# Patient Record
Sex: Female | Born: 1990 | Race: Black or African American | Hispanic: No | Marital: Single | Smoking: Never smoker
Health system: Southern US, Community
[De-identification: ages and names within clinical notes are randomized; demographics above are authoritative.]

## PROBLEM LIST (undated history)

## (undated) DIAGNOSIS — R519 Headache, unspecified: Secondary | ICD-10-CM

## (undated) DIAGNOSIS — N946 Dysmenorrhea, unspecified: Secondary | ICD-10-CM

## (undated) HISTORY — DX: Dysmenorrhea, unspecified: N94.6

## (undated) HISTORY — DX: Headache, unspecified: R51.9

## (undated) HISTORY — PX: NO PAST SURGERIES: SHX2092

---
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
## 2012-02-24 ENCOUNTER — Encounter (HOSPITAL_COMMUNITY): Payer: Self-pay

## 2012-02-24 ENCOUNTER — Emergency Department (INDEPENDENT_AMBULATORY_CARE_PROVIDER_SITE_OTHER)
Admission: EM | Admit: 2012-02-24 | Discharge: 2012-02-24 | Disposition: A | Discharge status: Home / Self Care | Payer: Medicaid Other | Source: Home / Self Care | Attending: Emergency Medicine | Admitting: Emergency Medicine

## 2012-02-24 DIAGNOSIS — L0291 Cutaneous abscess, unspecified: Secondary | ICD-10-CM

## 2012-02-24 MED ORDER — SULFAMETHOXAZOLE-TMP DS 800-160 MG PO TABS: 2.0000 | ORAL_TABLET | Freq: Two times a day (BID) | ORAL | Status: AC

## 2012-02-24 MED ORDER — MUPIROCIN 2 % EX OINT: TOPICAL_OINTMENT | Freq: Three times a day (TID) | CUTANEOUS | Status: AC

## 2012-02-24 NOTE — Discharge Instructions (Signed)
Abscess An abscess (boil or furuncle) is an infected area that contains a collection of pus.  SYMPTOMS Signs and symptoms of an abscess include pain, tenderness, redness, or hardness. You may feel a moveable soft area under your skin. An abscess can occur anywhere in the body.  TREATMENT  A surgical cut (incision) may be made over your abscess to drain the pus. Gauze may be packed into the space or a drain may be looped through the abscess cavity (pocket). This provides a drain that will allow the cavity to heal from the inside outwards. The abscess may be painful for a few days, but should feel much better if it was drained.  Your abscess, if seen early, may not have localized and may not have been drained. If not, another appointment may be required if it does not get better on its own or with medications. HOME CARE INSTRUCTIONS   Only take over-the-counter or prescription medicines for pain, discomfort, or fever as directed by your caregiver.   Take your antibiotics as directed if they were prescribed. Finish them even if you start to feel better.   Keep the skin and clothes clean around your abscess.   If the abscess was drained, you will need to use gauze dressing to collect any draining pus. Dressings will typically need to be changed 3 or more times a day.   The infection may spread by skin contact with others. Avoid skin contact as much as possible.   Practice good hygiene. This includes regular hand washing, cover any draining skin lesions, and do not share personal care items.   If you participate in sports, do not share athletic equipment, towels, whirlpools, or personal care items. Shower after every practice or tournament.   If a draining area cannot be adequately covered:   Do not participate in sports.   Children should not participate in day care until the wound has healed or drainage stops.   If your caregiver has given you a follow-up appointment, it is very important  to keep that appointment. Not keeping the appointment could result in a much worse infection, chronic or permanent injury, pain, and disability. If there is any problem keeping the appointment, you must call back to this facility for assistance.  SEEK MEDICAL CARE IF:   You develop increased pain, swelling, redness, drainage, or bleeding in the wound site.   You develop signs of generalized infection including muscle aches, chills, fever, or a general ill feeling.   You have an oral temperature above 102 F (38.9 C).  MAKE SURE YOU:   Understand these instructions.   Will watch your condition.   Will get help right away if you are not doing well or get worse.  Document Released: 09/09/2005 Document Revised: 11/19/2011 Document Reviewed: 07/03/2008 Exit[employer] Patient Information 2012 [employer].  You have had an abscess drained.  An abscess is a collection of pus caused by infection with skin bacteria such as Streptococcus or Staphylococcus.  Since this is and infection, you may be contagious.  For the first 2 days, leave the dressing in place and keep it clean and dry. This means you should not get it wet.  You will have to take a sponge bath rather than a shower.  If the abscess was packed, we may instruct you to come back in 2 to 3 days to have the packing removed.  If the abscess was not packed, you may remove the dressing yourself in 2 days and take   care of the wound yourself.  After the packing is out, change the dressing at least once a day.  You may bathe or shower once the packing has been removed.  Assemble all the dressing material before you change the dressing, wear gloves, dispose of the soiled dressing material well and wash your hands before and after changing the dressing.  Wash the area well with soap and water, taking care to remove all the dried blood and drainage.  Apply a thin layer of antibiotic ointment (Bacitracin or Polysporin) around the abscess cavity, then apply  a gauze dressing.  You may want to use a non-adherent dressing like Telfa.  Fasten this in place well with tape.  Continue to change the dressing until there is no further drainage.  Finish up the entire prescription of any antibiotics that you have been given.  Take infectious precautions since the bacteria that cause these abscesses may be contagious.  Wash hands frequently or use hand sanitizer, especially after touching the abscess area or changing dressings.  Do not allow anyone else to use your towel or washcloth and wash these items after each use until the abscess has healed.  You may want to use an antibacterial soap such as Dial or Safeguard or a prescription body wash like Hibiclens.  You also may want to consider spraying the tub or shower with a disinfectant such a Lysol until the abscess has healed.  Things that should prompt you to return to the office for a recheck include:  Fever over 100 degrees, increasing pain or drainage, failure of the abscess to heal after 10 days, or other skin lesions elsewhere.   

## 2012-02-24 NOTE — ED Provider Notes (Signed)
Chief Complaint  Patient presents with  . Skin Ulcer    History of Present Illness:   The patient is an 21 year old female who has had a painful nodule in the right submammary area since yesterday. This has not drained any fluid, blood, or pus. She has no other skin lesions and has not had prior history of skin infections or abscesses. She denies any fever or chills. She has not had diabetes or prior history of MRSA infections.  Review of Systems:  Other than noted above, the patient denies any of the following symptoms: Systemic:  No fever, chills, sweats, weight loss, or fatigue. ENT:  No nasal congestion, rhinorrhea, sore throat, swelling of lips, tongue or throat. Resp:  No cough, wheezing, or shortness of breath. Skin:  No rash, itching, nodules, or suspicious lesions.  PMFSH:  Past medical history, family history, social history, meds, and allergies were reviewed.  Physical Exam:   Vital signs:  BP 121/66  Pulse 80  Temp(Src) 98.6 F (37 C) (Oral)  Resp 10  SpO2 100% Gen:  Alert, oriented, in no distress. Skin:  She has a 1 x 1.5 cm tender, raised, red nodule in the right submammary area. This was not fluctuant and not draining any pus. Her skin was otherwise clear.  Assessment:   Diagnoses that have been ruled out:  None  Diagnoses that are still under consideration:  None  Final diagnoses:  Abscess    Plan:   1.  The following meds were prescribed:   New Prescriptions   MUPIROCIN OINTMENT (BACTROBAN) 2 %    Apply topically 3 (three) times daily.   SULFAMETHOXAZOLE-TRIMETHOPRIM (BACTRIM DS) 800-160 MG PER TABLET    Take 2 tablets by mouth 2 (two) times daily.   2.  The patient was instructed in symptomatic care and handouts were given. 3.  The patient was told to return if becoming worse in any way, if no better in 3 or 4 days, and given some red flag symptoms that would indicate earlier return.     Reuben Likes, MD 02/24/12 [phone]

## 2012-02-24 NOTE — ED Notes (Signed)
Concerned about lesion on right anterior chest wall since yesterday; no other lesions present, no one else in home affected

## 2012-05-27 ENCOUNTER — Ambulatory Visit (INDEPENDENT_AMBULATORY_CARE_PROVIDER_SITE_OTHER): Payer: Medicaid Other | Admitting: Obstetrics and Gynecology

## 2012-05-27 ENCOUNTER — Encounter: Payer: Self-pay | Admitting: Obstetrics and Gynecology

## 2012-05-27 VITALS — BP 108/64 | Temp 98.9°F | Resp 16 | Ht 75.0 in | Wt 190.0 lb

## 2012-05-27 DIAGNOSIS — N946 Dysmenorrhea, unspecified: Secondary | ICD-10-CM | POA: Insufficient documentation

## 2012-05-27 DIAGNOSIS — Z309 Encounter for contraceptive management, unspecified: Secondary | ICD-10-CM

## 2012-05-27 MED ORDER — LEVONORGEST-ETH ESTRAD 91-DAY 0.15-0.03 &0.01 MG PO TABS: 1.0000 | ORAL_TABLET | Freq: Every day | ORAL | Status: DC

## 2012-05-27 NOTE — Progress Notes (Signed)
Patient ID: Mackenzie Mcknight, female   DOB: May 13, 1991, 21 y.o.   MRN: [id number] Last Pap: New GYN WNL: N/A Regular Periods:yes Contraception: None  Monthly Breast exam:yes Tetanus<4yrs:yes Nl.Bladder Function:yes Daily BMs:no Healthy Diet:yes Calcium:no/takes Biotin Mammogram:no Exercise:yes Seatbelt: yes Abuse at home: no Stressful work:no Sigmoid-colonoscopy: Never had Bone Density: No  *Wants to discuss birth control options (Contraception list provided) Pt with a mense q month.  Lasting 7-8 days.  Pain 8/10 from cramps no relief with midol.  Changes a pad 4 times a day Pt without complaints Physical Examination: General appearance - alert, well appearing, and in no distress Mental status - normal mood, behavior, speech, dress, motor activity, and thought processes Neck - supple, no significant adenopathy, thyroid exam: thyroid is normal in size without nodules or tenderness Chest - clear to auscultation, no wheezes, rales or rhonchi, symmetric air entry Heart - normal rate and regular rhythm Abdomen - soft, nontender, nondistended, no masses or organomegaly Breasts - breasts appear normal, no suspicious masses, no skin or nipple changes or axillary nodes Pelvic - normal external genitalia.  Pt declined speculum exam.  Pelvic exam.  Normal size uterus , non tender, no adnexal  tenderness Rectal - normal rectal, no masses, rectal exam not indicated Back exam - full range of motion, no tenderness, palpable spasm or pain on motion Neurological - alert, oriented, normal speech, no focal findings or movement disorder noted Musculoskeletal - no joint tenderness, deformity or swelling Extremities - no edema, redness or tenderness in the calves or thighs Skin - normal coloration and turgor, no rashes, no suspicious skin lesions noted Routine exam Pap sent no Mammogram due no all BC reviewed.  pt chose seasonique.  written and verbal instructions given RT 3 months Pt not sexually  active for 2 yrs.  She declined cultures

## 2012-09-02 ENCOUNTER — Telehealth: Payer: Self-pay | Admitting: Obstetrics and Gynecology

## 2012-09-02 NOTE — Telephone Encounter (Signed)
Spoke with pt rgd msg pt states started new bc having irreg bleeding advised pt normal to have irreg bleeding when starting new bc will have irreg bleeding first three months of starting pt also to have 3 mo f/u pt has appt 09/16/12 at 2:15 with ND also advised pt rx at pharm pt voice understanding

## 2012-09-16 ENCOUNTER — Ambulatory Visit (INDEPENDENT_AMBULATORY_CARE_PROVIDER_SITE_OTHER): Payer: Medicaid Other | Admitting: Obstetrics and Gynecology

## 2012-09-16 ENCOUNTER — Encounter: Payer: Self-pay | Admitting: Obstetrics and Gynecology

## 2012-09-16 VITALS — BP 108/58 | Wt 207.5 lb

## 2012-09-16 DIAGNOSIS — Z3041 Encounter for surveillance of contraceptive pills: Secondary | ICD-10-CM

## 2012-09-16 NOTE — Progress Notes (Signed)
Pt here for [facility] pill f/u.  Pt without c/o .  No SOB, CP or leg pain.  Pt has had spotting.  The spotting has not resolved BP 108/58  Wt 207 lb 8 oz (94.121 kg)  LMP 09/08/2012 Physical Examination: General appearance - alert, well appearing, and in no distress Heart - normal rate and regular rhythm Abdomen - soft, nontender, nondistended, no masses or organomegaly Extremities - peripheral pulses normal, no pedal edema, no clubbing or cyanosis BC F/U Pt doing well continue seasonique

## 2012-09-16 NOTE — Patient Instructions (Signed)
Hormonal Contraception Information Estrogen and progesterone (progestin) are hormones used in many forms of birth control (contraception). These 2 hormones make up most hormonal contraceptives. Hormonal contraceptives use either:   A combination of estrogen hormone and progesterone hormone in the form of a:  Pill. Typical pill packs include 21 days of active hormone pills and 7 days of non-hormonal pills.During the non-hormone week, you will have your period. There are certain types of pills that include more days of active hormones.  Patch. The patch is placed on the lower abdomen every week for 3 weeks, and not on the fourth week.  Vaginal ring. The ring is placed in the vagina and left there for 3 weeks, and removed for 1 week.  Progesterone alone in the form of a(n):  Pill. Hormone pills are taken every day of the cycle.  Intrauterine device (IUD). The IUD is inserted during a menstrual period and removed or replaced every 5 years or less.  Implant. Plastic rods are placed under the skin of the upper arm and are removed or replaced every 3 years or less.  Injection. The injection is given once every 90 days. Pregnancy can still occur with any of these hormonal contraceptive methods. If there is any suspicion of pregnancy, take a pregnancy test and talk to your caregiver. ESTROGEN AND PROGESTERONE CONTRACEPTIVES Estrogen and progesterone contraceptives can prevent pregnancy by:  Stopping the actions of other reproductive hormones.   Stopping the release of an egg (ovulation).  Changing the lining of the uterus. This change makes it more difficult for an egg to implant. Side effects from estrogen occur more often in the first 2 or 3 months. Talk to your caregiver about what side effects may affect you. If you develop persistent side effects or they are severe, talk to your caregiver. PROGESTERONE CONTRACEPTIVES Progesterone only contraceptives can prevent pregnancy by:   Blocking  ovulation.  Preventing the entry of sperm into the uterus by keeping the cervical mucus thick and sticky.  Slowing the action of fallopian tubes to slow sperm transport.  Changing the lining of the uterus. This change makes it more difficult for an egg to implant. Side effects of progesterone can vary. Talk to your caregiver about what side effects may affect you. If you develop persistent side effects or they are severe, talk to your caregiver. Document Released: 12/20/2007 Document Revised: 02/22/2012 Document Reviewed: 04/04/2011 Exit[employer] Patient Information 2013 [employer].  

## 2012-10-14 ENCOUNTER — Inpatient Hospital Stay (HOSPITAL_COMMUNITY)
Admission: AD | Admit: 2012-10-14 | Discharge: 2012-10-14 | Disposition: A | Discharge status: Home / Self Care | Payer: Medicaid Other | Source: Ambulatory Visit | Attending: Obstetrics & Gynecology | Admitting: Obstetrics & Gynecology

## 2012-10-14 ENCOUNTER — Telehealth: Payer: Self-pay | Admitting: Obstetrics and Gynecology

## 2012-10-14 DIAGNOSIS — B9689 Other specified bacterial agents as the cause of diseases classified elsewhere: Secondary | ICD-10-CM | POA: Insufficient documentation

## 2012-10-14 DIAGNOSIS — N949 Unspecified condition associated with female genital organs and menstrual cycle: Secondary | ICD-10-CM | POA: Insufficient documentation

## 2012-10-14 DIAGNOSIS — B373 Candidiasis of vulva and vagina: Secondary | ICD-10-CM | POA: Insufficient documentation

## 2012-10-14 DIAGNOSIS — B3731 Acute candidiasis of vulva and vagina: Secondary | ICD-10-CM

## 2012-10-14 DIAGNOSIS — A499 Bacterial infection, unspecified: Secondary | ICD-10-CM | POA: Insufficient documentation

## 2012-10-14 DIAGNOSIS — N760 Acute vaginitis: Secondary | ICD-10-CM

## 2012-10-14 LAB — WET PREP, GENITAL: Trich, Wet Prep: NONE SEEN

## 2012-10-14 LAB — URINALYSIS, ROUTINE W REFLEX MICROSCOPIC
Glucose, UA: NEGATIVE mg/dL
Hgb urine dipstick: NEGATIVE
Specific Gravity, Urine: 1.025 (ref 1.005–1.030)
pH: 6.5 (ref 5.0–8.0)

## 2012-10-14 LAB — POCT PREGNANCY, URINE: Preg Test, Ur: NEGATIVE

## 2012-10-14 LAB — URINE MICROSCOPIC-ADD ON

## 2012-10-14 MED ORDER — FLUCONAZOLE 150 MG PO TABS: ORAL_TABLET | ORAL | Status: DC

## 2012-10-14 MED ORDER — METRONIDAZOLE 500 MG PO TABS: 500.0000 mg | ORAL_TABLET | Freq: Two times a day (BID) | ORAL | Status: DC

## 2012-10-14 MED ORDER — NYSTATIN-TRIAMCINOLONE 100000-0.1 UNIT/GM-% EX CREA: TOPICAL_CREAM | CUTANEOUS | Status: DC

## 2012-10-14 NOTE — MAU Provider Note (Signed)
History     CSN: [id number]  Arrival date and time: 10/14/12 1408   First Provider Initiated Contact with Patient 10/14/12 1742      Chief Complaint  Patient presents with  . Vag issues    HPI  Pt is not pregnant and presents with vaginal irritation and reddness for 2 days.  Pt is a pt of [facility] and could not get an appointment until next week and pt has to return to school on Sunday in [place].  Pt is on [place]. She denies vaginal discharge  Past Medical History  Diagnosis Date  . Dysmenorrhea     No past surgical history on file.  No family history on file.  History  Substance Use Topics  . Smoking status: Never Smoker   . Smokeless tobacco: Never Used  . Alcohol Use: No    Allergies: No Known Allergies  Prescriptions prior to admission  Medication Sig Dispense Refill  . Levonorgestrel-Ethinyl Estradiol (SEASONIQUE) 0.15-0.03 &0.01 MG tablet Take 1 tablet by mouth daily.  1 Package  4    Review of Systems  Constitutional: Negative for fever and chills.  Gastrointestinal: Negative for nausea and vomiting.  Genitourinary: Negative for dysuria and urgency.   Physical Exam   Blood pressure 109/59, pulse 84, temperature 98.2 F (36.8 C), temperature source Oral, resp. rate 16, height 6\' 3"  (1.905 m), weight 92.194 kg (203 lb 4 oz), last menstrual period 09/08/2012.  Physical Exam  Nursing note and vitals reviewed. Constitutional: She is oriented to person, place, and time. She appears well-developed and well-nourished.  HENT:  Head: Normocephalic.  Eyes: Pupils are equal, round, and reactive to light.  Neck: Normal range of motion. Neck supple.  Cardiovascular: Normal rate.   Respiratory: Effort normal.  GI: Soft. She exhibits no distension. There is no tenderness. There is no rebound and no guarding.  Genitourinary:       Labia slightly edematous bilaterally; vaginal mucosa reddened; mod amount of frothy white discharge in vault; cervix- eversion noted,  NT; uterus and adnexa without palpable enlargement or tenderness  Musculoskeletal: Normal range of motion.  Neurological: She is alert and oriented to person, place, and time.  Skin: Skin is warm and dry.  Psychiatric: She has a normal mood and affect.    MAU Course  Procedures Results for orders placed during the hospital encounter of 10/14/12 (from the past 24 hour(s))  URINALYSIS, ROUTINE W REFLEX MICROSCOPIC     Status: Abnormal   Collection Time   10/14/12  3:40 PM      Component Value Range   Color, Urine YELLOW  YELLOW   APPearance CLOUDY (*) CLEAR   Specific Gravity, Urine 1.025  1.005 - 1.030   pH 6.5  5.0 - 8.0   Glucose, UA NEGATIVE  NEGATIVE mg/dL   Hgb urine dipstick NEGATIVE  NEGATIVE   Bilirubin Urine NEGATIVE  NEGATIVE   Ketones, ur NEGATIVE  NEGATIVE mg/dL   Protein, ur NEGATIVE  NEGATIVE mg/dL   Urobilinogen, UA 1.0  0.0 - 1.0 mg/dL   Nitrite NEGATIVE  NEGATIVE   Leukocytes, UA LARGE (*) NEGATIVE  URINE MICROSCOPIC-ADD ON     Status: Abnormal   Collection Time   10/14/12  3:40 PM      Component Value Range   Squamous Epithelial / LPF MANY (*) RARE   WBC, UA 21-50  <3 WBC/hpf   Bacteria, UA MANY (*) RARE  POCT PREGNANCY, URINE     Status: Normal   Collection Time  10/14/12  5:48 PM      Component Value Range   Preg Test, Ur NEGATIVE  NEGATIVE  WET PREP, GENITAL     Status: Abnormal   Collection Time   10/14/12  6:03 PM      Component Value Range   Yeast Wet Prep HPF POC NONE SEEN  NONE SEEN   Trich, Wet Prep NONE SEEN  NONE SEEN   Clue Cells Wet Prep HPF POC MODERATE (*) NONE SEEN   WBC, Wet Prep HPF POC MODERATE (*) NONE SEEN     Assessment and Plan  Yeast vulvo vaginitis- clinically- Diflucan 150mg  one table PO now and repeat in 72 hours if symptoms persist Mycolog II cream- apply small amount to external affected areas BID prn irritation- prophylaxis discussd BV- Flagyl 500mg  BID for 7 days #14 Continue Seasonique F/u with [facility] for persistant or  recurring sx  Mackenzie Mcknight 10/14/2012, 5:43 PM

## 2012-10-14 NOTE — MAU Note (Signed)
Pt states has had redness, itching and burning since Wednesday. Denies abnormal vaginal d/c or bleeding. LMP-09/02/2012.

## 2012-10-15 LAB — URINE CULTURE: Culture: NO GROWTH

## 2012-10-15 LAB — GC/CHLAMYDIA PROBE AMP, GENITAL: Chlamydia, DNA Probe: NEGATIVE

## 2012-10-17 NOTE — Telephone Encounter (Signed)
Pt seen at [facility] for complaints

## 2013-06-21 ENCOUNTER — Encounter (HOSPITAL_COMMUNITY): Payer: Self-pay | Admitting: *Deleted

## 2013-06-21 ENCOUNTER — Inpatient Hospital Stay (HOSPITAL_COMMUNITY)
Admission: AD | Admit: 2013-06-21 | Discharge: 2013-06-21 | Disposition: A | Discharge status: Home / Self Care | Payer: BC Managed Care – PPO | Source: Ambulatory Visit | Attending: Obstetrics & Gynecology | Admitting: Obstetrics & Gynecology

## 2013-06-21 DIAGNOSIS — N750 Cyst of Bartholin's gland: Secondary | ICD-10-CM | POA: Insufficient documentation

## 2013-06-21 MED ORDER — OXYCODONE-ACETAMINOPHEN 5-325 MG PO TABS: 2.0000 | ORAL_TABLET | ORAL | Status: DC | PRN

## 2013-06-21 MED ORDER — IBUPROFEN 800 MG PO TABS
800.0000 mg | ORAL_TABLET | Freq: Once | ORAL | Status: AC
  Administered 2013-06-21: 800 mg via ORAL
  Filled 2013-06-21: qty 1

## 2013-06-21 MED ORDER — LIDOCAINE HCL 2 % EX GEL
Freq: Once | CUTANEOUS | Status: AC
  Administered 2013-06-21: 5 via TOPICAL
  Filled 2013-06-21: qty 20

## 2013-06-21 MED ORDER — LIDOCAINE HCL (PF) 1 % IJ SOLN
INTRAMUSCULAR | Status: AC
  Administered 2013-06-21: 20:00:00
  Filled 2013-06-21: qty 30

## 2013-06-21 MED ORDER — HYDROMORPHONE HCL PF 1 MG/ML IJ SOLN
1.0000 mg | Freq: Once | INTRAMUSCULAR | Status: AC
  Administered 2013-06-21: 1 mg via INTRAMUSCULAR
  Filled 2013-06-21: qty 1

## 2013-06-21 NOTE — Progress Notes (Signed)
Pt denies being in a relationship at this time

## 2013-06-21 NOTE — Procedures (Signed)
Incision and Drainage Procedure Note  Pre-operative Diagnosis: Bartholin Cyst   Post-operative Diagnosis: same  Indications: Pain relief  Anesthesia: 1% plain lidocaine  Procedure Details  The procedure, risks and complications have been discussed in detail (including, but not limited to airway compromise, infection, bleeding) with the patient, and the patient has signed consent to the procedure.  The skin was sterilely prepped and draped over the affected area in the usual fashion. After adequate local anesthesia, I&D with a #11 blade was performed on the bartholin gland on right side of vagina. Purulent drainage: large amount present. Word catheter placed and filled with 2 ml of saline. The patient was observed until stable.  Findings: Purulent drainage  EBL: 5 cc's  Drains: Word catheter  Condition: Tolerated procedure well and Stable   Complications: none.  Tawni Carnes MD 06/21/2013 Pt was seen with Dr. Delford Field and assisted I&D procedure. Pamelia Hoit, WHNP-BC

## 2013-06-21 NOTE — MAU Note (Signed)
Pt noticed a cyst on the right side of her vulva, 5 days ago

## 2013-06-21 NOTE — MAU Provider Note (Addendum)
  History     CSN: [id number]  Arrival date and time: 06/21/13 1701   First Provider Initiated Contact with Patient 06/21/13 1811      Chief Complaint  Patient presents with  . 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  The pt had wet prep and GC/Chlamydia testing at [employer] with results pending.  Pt is not sexually active. Pt has had cyst on the right side of her vagina for 5 days and has progressively gotten worse.  Pt has been soaking in tub of warm water, which she says has made the cyst larger and more painful.  Past Medical History  Diagnosis Date  . Dysmenorrhea     History reviewed. No pertinent past surgical history.  Family History  Problem Relation Age of Onset  . Diabetes Paternal Grandmother   . Alcohol abuse Neg Hx   . Arthritis Neg Hx   . Asthma Neg Hx   . Birth defects Neg Hx   . Cancer Neg Hx   . COPD Neg Hx   . Depression Neg Hx   . Drug abuse Neg Hx   . Early death Neg Hx   . Hearing loss Neg Hx   . Heart disease Neg Hx   . Hyperlipidemia Neg Hx   . Hypertension Neg Hx   . Kidney disease Neg Hx   . Learning disabilities Neg Hx   . Mental illness Neg Hx   . Mental retardation Neg Hx   . Miscarriages / Stillbirths Neg Hx   . Stroke Neg Hx   . Vision loss Neg Hx     History  Substance Use Topics  . Smoking status: Never Smoker   . Smokeless tobacco: Never Used  . Alcohol Use: No    Allergies: No Known Allergies  No prescriptions prior to admission    Review of Systems  Constitutional: Negative for fever and chills.  Gastrointestinal: Negative for nausea, vomiting, abdominal pain and diarrhea.  Genitourinary: Negative for dysuria and urgency.   Physical Exam   Blood pressure 95/53, pulse 89, temperature 99.2 F (37.3 C), temperature source Oral, resp. rate 16, height 6\' 3"  (1.905 m), weight 87.091 kg (192 lb).  Physical Exam  Vitals  reviewed. Constitutional: She is oriented to person, place, and time. She appears well-developed and well-nourished. No distress.  HENT:  Head: Normocephalic.  Eyes: Pupils are equal, round, and reactive to light.  Neck: Normal range of motion. Neck supple.  Cardiovascular: Normal rate.   Respiratory: Effort normal.  GI: Soft.  Genitourinary:  Golf ball size Bartholin cyst on right side of labia, tender to touch, not draining.  Musculoskeletal: Normal range of motion.  Neurological: She is alert and oriented to person, place, and time.  Skin: Skin is warm and dry.    MAU Course  Procedures Dilaudid 1mg  IM given for pre-procedure pain as well as topical lidocaine jelly Dr. Delford Field, FP resident, did procedure with great results of purulent drainage and placement of WORD catheter-  Consent signed.  Pt tolerated procedure well Pt ready for discharge and complained of headache rated 9/10  Pt given Ibuprofen 800mg  PO with relief Assessment and Plan  Bartholin Gland cyst with I&D Percocet prescription F/u in GYN clinic in 2 weeks- return sooner if increase in pain or fever Aija Scarfo 06/21/2013, 7:45 PM

## 2015-09-13 ENCOUNTER — Encounter (HOSPITAL_COMMUNITY): Payer: Self-pay | Admitting: Emergency Medicine

## 2015-09-13 ENCOUNTER — Emergency Department (HOSPITAL_COMMUNITY)
Admission: EM | Admit: 2015-09-13 | Discharge: 2015-09-13 | Disposition: A | Discharge status: Home / Self Care | Payer: Self-pay | Source: Home / Self Care | Attending: Family Medicine | Admitting: Family Medicine

## 2015-09-13 DIAGNOSIS — R0982 Postnasal drip: Secondary | ICD-10-CM

## 2015-09-13 DIAGNOSIS — J029 Acute pharyngitis, unspecified: Secondary | ICD-10-CM

## 2015-09-13 LAB — POCT RAPID STREP A: Streptococcus, Group A Screen (Direct): NEGATIVE
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
## 2015-09-13 NOTE — ED Provider Notes (Signed)
CSN: [id number]     Arrival date & time 09/13/15  1745 History   First MD Initiated Contact with Patient 09/13/15 1855     Chief Complaint  Patient presents with  . Sore Throat   (Consider location/radiation/quality/duration/timing/severity/associated sxs/prior Treatment) HPI Comments: 24 year old female complaining of a sore throat for 8 days. She states that at the outset she had a temperature of 102. The only other associated symptom is nasal drainage. She denies PND, chest pain or shortness of breath.   Past Medical History  Diagnosis Date  . Dysmenorrhea    History reviewed. No pertinent past surgical history. Family History  Problem Relation Age of Onset  . Diabetes Paternal Grandmother   . Alcohol abuse Neg Hx   . Arthritis Neg Hx   . Asthma Neg Hx   . Birth defects Neg Hx   . Cancer Neg Hx   . COPD Neg Hx   . Depression Neg Hx   . Drug abuse Neg Hx   . Early death Neg Hx   . Hearing loss Neg Hx   . Heart disease Neg Hx   . Hyperlipidemia Neg Hx   . Hypertension Neg Hx   . Kidney disease Neg Hx   . Learning disabilities Neg Hx   . Mental illness Neg Hx   . Mental retardation Neg Hx   . Miscarriages / Stillbirths Neg Hx   . Stroke Neg Hx   . Vision loss Neg Hx    Social History  Substance Use Topics  . Smoking status: Never Smoker   . Smokeless tobacco: Never Used  . Alcohol Use: No   OB History    Gravida Para Term Preterm AB TAB SAB Ectopic Multiple Living   0              Review of Systems  Constitutional: Positive for fever. Negative for activity change and fatigue.  HENT: Positive for ear pain, rhinorrhea and sore throat. Negative for congestion, postnasal drip, sinus pressure and sneezing.   Respiratory: Negative for cough, shortness of breath and wheezing.   Cardiovascular: Negative for chest pain and leg swelling.  Gastrointestinal: Negative.   Musculoskeletal: Negative.   Skin: Negative for rash.  Neurological: Negative.     Allergies   Review of patient's allergies indicates no known allergies.  Home Medications   Prior to Admission medications   Medication Sig Start Date End Date Taking? Authorizing Provider  oxyCODONE-acetaminophen (PERCOCET/ROXICET) 5-325 MG per tablet Take 2 tablets by mouth every 4 (four) hours as needed for pain. 06/21/13   Jean Rosenthal, NP   Meds Ordered and Administered this Visit  Medications - No data to display  BP 93/62 mmHg  Pulse 75  Temp(Src) 99.1 F (37.3 C) (Oral)  Resp 20  SpO2 99%  LMP 08/16/2015 No data found.   Physical Exam  Constitutional: She is oriented to person, place, and time. She appears well-developed and well-nourished. No distress.  HENT:  Mouth/Throat: No oropharyngeal exudate.  Right TM with minor retraction. Otherwise translucent. No discoloration or effusion. Left TM normal. Oropharynx with clear PND, minor erythema and cobblestoning.  Eyes: Conjunctivae and EOM are normal.  Neck: Normal range of motion. Neck supple.  Small, shoddy nontender anterior cervical nodes bilaterally. (2 nodes).  Cardiovascular: Normal rate, regular rhythm and normal heart sounds.   Pulmonary/Chest: Effort normal and breath sounds normal. No respiratory distress. She has no wheezes. She has no rales.  Musculoskeletal: She exhibits no edema.  Neurological: She  is alert and oriented to person, place, and time. No cranial nerve deficit. She exhibits normal muscle tone.  Skin: Skin is warm and dry. No rash noted.  Psychiatric: She has a normal mood and affect.  Nursing note and vitals reviewed.   ED Course  Procedures (including critical care time)  Labs Review Labs Reviewed  POCT RAPID STREP A   Results for orders placed or performed during the hospital encounter of 09/13/15  POCT rapid strep A [facility])  Result Value Ref Range   Streptococcus, Group A Screen (Direct) NEGATIVE NEGATIVE     Imaging Review No results found.   Visual Acuity  Review  Right Eye Distance:   Left Eye Distance:   Bilateral Distance:    Right Eye Near:   Left Eye Near:    Bilateral Near:         MDM   1. Pharyngitis   2. PND (post-nasal drip)    You have evidence of drainage in the back of the throat. This may be contributing to the pain in your throat. Medicine such as Allegra, Claritin or Zyrtec can help minimize drainage. May also use Cepacol lozenges which help numb the throat.: Drink cool liquids. Tylenol or Motrin for throat pain as needed. Your strep test was negative.     Hayden Rasmussen, NP 09/13/15 [phone]

## 2015-09-13 NOTE — Discharge Instructions (Signed)
Pharyngitis You have evidence of drainage in the back of the throat. This may be contributing to the pain in your throat. Medicine such as Allegra, Claritin or Zyrtec can help minimize drainage. May also use Cepacol lozenges which help numb the throat.: Drink cool liquids. Tylenol or Motrin for throat pain as needed. Your strep test was negative. Pharyngitis is redness, pain, and swelling (inflammation) of your pharynx.  CAUSES  Pharyngitis is usually caused by infection. Most of the time, these infections are from viruses (viral) and are part of a cold. However, sometimes pharyngitis is caused by bacteria (bacterial). Pharyngitis can also be caused by allergies. Viral pharyngitis may be spread from person to person by coughing, sneezing, and personal items or utensils (cups, forks, spoons, toothbrushes). Bacterial pharyngitis may be spread from person to person by more intimate contact, such as kissing.  SIGNS AND SYMPTOMS  Symptoms of pharyngitis include:   Sore throat.   Tiredness (fatigue).   Low-grade fever.   Headache.  Joint pain and muscle aches.  Skin rashes.  Swollen lymph nodes.  Plaque-like film on throat or tonsils (often seen with bacterial pharyngitis). DIAGNOSIS  Your health care provider will ask you questions about your illness and your symptoms. Your medical history, along with a physical exam, is often all that is needed to diagnose pharyngitis. Sometimes, a rapid strep test is done. Other lab tests may also be done, depending on the suspected cause.  TREATMENT  Viral pharyngitis will usually get better in 3-4 days without the use of medicine. Bacterial pharyngitis is treated with medicines that kill germs (antibiotics).  HOME CARE INSTRUCTIONS   Drink enough water and fluids to keep your urine clear or pale yellow.   Only take over-the-counter or prescription medicines as directed by your health care provider:   If you are prescribed antibiotics, make sure  you finish them even if you start to feel better.   Do not take aspirin.   Get lots of rest.   Gargle with 8 oz of salt water ( tsp of salt per 1 qt of water) as often as every 1-2 hours to soothe your throat.   Throat lozenges (if you are not at risk for choking) or sprays may be used to soothe your throat. SEEK MEDICAL CARE IF:   You have large, tender lumps in your neck.  You have a rash.  You cough up green, yellow-brown, or bloody spit. SEEK IMMEDIATE MEDICAL CARE IF:   Your neck becomes stiff.  You drool or are unable to swallow liquids.  You vomit or are unable to keep medicines or liquids down.  You have severe pain that does not go away with the use of recommended medicines.  You have trouble breathing (not caused by a stuffy nose). MAKE SURE YOU:   Understand these instructions.  Will watch your condition.  Will get help right away if you are not doing well or get worse. Document Released: 11/30/2005 Document Revised: 09/20/2013 Document Reviewed: 08/07/2013 [facility] Patient Information 2015 [place], [place]. This information is not intended to replace advice given to you by your health care provider. Make sure you discuss any questions you have with your health care provider.

## 2015-09-16 LAB — CULTURE, GROUP A STREP: STREP A CULTURE: NEGATIVE

## 2015-09-16 NOTE — ED Notes (Signed)
Final report of strep testing negative  

## 2016-12-03 ENCOUNTER — Ambulatory Visit (INDEPENDENT_AMBULATORY_CARE_PROVIDER_SITE_OTHER): Payer: Self-pay

## 2016-12-03 ENCOUNTER — Ambulatory Visit (HOSPITAL_COMMUNITY)
Admission: EM | Admit: 2016-12-03 | Discharge: 2016-12-03 | Disposition: A | Discharge status: Home / Self Care | Payer: Self-pay | Attending: Emergency Medicine | Admitting: Emergency Medicine

## 2016-12-03 ENCOUNTER — Encounter (HOSPITAL_COMMUNITY): Payer: Self-pay | Admitting: Emergency Medicine

## 2016-12-03 DIAGNOSIS — S161XXA Strain of muscle, fascia and tendon at neck level, initial encounter: Secondary | ICD-10-CM

## 2016-12-03 MED ORDER — NAPROXEN 500 MG PO TABS: 500.0000 mg | ORAL_TABLET | Freq: Two times a day (BID) | ORAL | 0 refills | Status: DC | PRN

## 2016-12-03 MED ORDER — BACLOFEN 10 MG PO TABS: 10.0000 mg | ORAL_TABLET | Freq: Three times a day (TID) | ORAL | 0 refills | Status: DC | PRN
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
## 2020-07-12 ENCOUNTER — Emergency Department (HOSPITAL_COMMUNITY): Payer: HRSA Program

## 2020-07-12 ENCOUNTER — Encounter (HOSPITAL_COMMUNITY): Payer: Self-pay | Admitting: Emergency Medicine

## 2020-07-12 ENCOUNTER — Emergency Department (HOSPITAL_COMMUNITY)
Admission: EM | Admit: 2020-07-12 | Discharge: 2020-07-12 | Disposition: A | Discharge status: Home / Self Care | Payer: HRSA Program | Attending: Emergency Medicine | Admitting: Emergency Medicine

## 2020-07-12 ENCOUNTER — Other Ambulatory Visit: Payer: Self-pay

## 2020-07-12 DIAGNOSIS — M791 Myalgia, unspecified site: Secondary | ICD-10-CM | POA: Diagnosis present

## 2020-07-12 DIAGNOSIS — F17210 Nicotine dependence, cigarettes, uncomplicated: Secondary | ICD-10-CM | POA: Insufficient documentation

## 2020-07-12 DIAGNOSIS — U071 COVID-19: Secondary | ICD-10-CM | POA: Insufficient documentation

## 2020-07-12 MED ORDER — ALBUTEROL SULFATE HFA 108 (90 BASE) MCG/ACT IN AERS: 1.0000 | INHALATION_SPRAY | Freq: Four times a day (QID) | RESPIRATORY_TRACT | 0 refills | Status: DC | PRN

## 2020-07-12 MED ORDER — BENZONATATE 100 MG PO CAPS: 100.0000 mg | ORAL_CAPSULE | Freq: Three times a day (TID) | ORAL | 0 refills | Status: DC

## 2020-07-12 MED ORDER — PREDNISONE 10 MG (21) PO TBPK: ORAL_TABLET | ORAL | 0 refills | Status: DC

## 2020-07-12 MED ORDER — ONDANSETRON 4 MG PO TBDP: 4.0000 mg | ORAL_TABLET | Freq: Three times a day (TID) | ORAL | 0 refills | Status: DC | PRN

## 2020-07-12 NOTE — Discharge Instructions (Signed)
General Viral Syndrome Care Instructions:  Your symptoms are likely consistent with a viral illness. Viruses do not require or respond to antibiotics. Treatment is symptomatic care and it is important to note that these symptoms may last for 7-14 days.   Hand washing: Wash your hands throughout the day, but especially before and after touching the face, using the restroom, sneezing, coughing, or touching surfaces that have been coughed or sneezed upon. Hydration: Symptoms of most illnesses will be intensified and complicated by dehydration. Dehydration can also extend the duration of symptoms. Drink plenty of fluids and get plenty of rest. You should be drinking at least half a liter of water an hour to stay hydrated. Electrolyte drinks (ex. Gatorade, Powerade, Pedialyte) are also encouraged. You should be drinking enough fluids to make your urine light yellow, almost clear. If this is not the case, you are not drinking enough water. Please note that some of the treatments indicated below will not be effective if you are not adequately hydrated. Diet: Please concentrate on hydration, however, you may introduce food slowly.  Start with a clear liquid diet, progressed to a full liquid diet, and then bland solids as you are able. Pain or fever: Ibuprofen, Naproxen, or acetaminophen (generic for Tylenol) for pain or fever.  Antiinflammatory medications: Take 600 mg of ibuprofen every 6 hours or 440 mg (over the counter dose) to 500 mg (prescription dose) of naproxen every 12 hours for the next 3 days. After this time, these medications may be used as needed for pain. Take these medications with food to avoid upset stomach. Choose only one of these medications, do not take them together. Acetaminophen (generic for Tylenol): Should you continue to have additional pain while taking the ibuprofen or naproxen, you may add in acetaminophen as needed. Your daily total maximum amount of acetaminophen from all sources  should be limited to 4000mg /day for persons without liver problems, or 2000mg /day for those with liver problems. Nausea/vomiting: Use the ondansetron (generic for Zofran) for nausea or vomiting.  This medication may not prevent all vomiting or nausea, but can help facilitate better hydration. Things that can help with nausea/vomiting also include peppermint/menthol candies, vitamin B12, and ginger. Diarrhea: May use medications such as loperamide (Imodium) or Bismuth subsalicylate (Pepto-Bismol). Cough: Use the benzonatate (generic for Tessalon) for cough.  Teas, warm liquids, broths, and honey can also help with cough. Albuterol: May use the albuterol as needed for instances of shortness of breath. Prednisone: Take the prednisone, as directed, in its entirety. Zyrtec or Claritin: May add these medication daily to control underlying symptoms of congestion, sneezing, and other signs of allergies.  These medications are available over-the-counter. Generics: Cetirizine (generic for Zyrtec) and loratadine (generic for Claritin). Fluticasone: Use fluticasone (generic for Flonase), as directed, for nasal and sinus congestion.  This medication is available over-the-counter. Congestion: Plain guaifenesin (generic for plain Mucinex) may help relieve congestion. Saline sinus rinses and saline nasal sprays may also help relieve congestion. If you do not have high blood pressure, heart problems, or an allergy to such medications, you may also try phenylephrine or Sudafed. Sore throat: Warm liquids or Chloraseptic spray may help soothe a sore throat. Gargle twice a day with a salt water solution made from a half teaspoon of salt in a cup of warm water.  Follow up: Follow up with a primary care provider within the next two weeks should symptoms fail to resolve. Return: Return to the ED for significantly worsening symptoms, shortness of breath,  persistent vomiting, large amounts of blood in stool, or any other major  concerns.  For prescription assistance, may try using prescription discount sites or apps, such as goodrx.com  COVID-19 isolation recommendations  Patients who have symptoms consistent with COVID-19 should self isolate until: At least 3 days (72 hours) have passed since recovery, defined as resolution of fever without the use of fever reducing medications and improvement in respiratory symptoms (e.g., cough, shortness of breath), and At least 7 days have passed since symptoms first appeared. Retesting is not required and not recommended as patients can continue to test positive for several weeks despite lack of symptoms.

## 2020-07-12 NOTE — ED Provider Notes (Signed)
[facility]-EMERGENCY DEPT Provider Note   CSN: [id number] Arrival date & time: 07/12/20  1301     History Chief Complaint  Patient presents with  . covid positive    Mackenzie Mcknight is a 29 y.o. female.  HPI       Mackenzie Mcknight is a 29 y.o. female, with a history of tobacco use and COVID-19, presenting to the ED with body aches, including the chest and the back for the last 8 days. Cough, congestion, and occasional shortness of breath.  Tested positive for Covid 1 week ago.  Comes in for reevaluation since she is still having symptoms. Intermittent fever, but none today.  Denies syncope, dizziness, current shortness of breath, exertional chest pain, lower extremity edema/pain, abdominal pain, vomiting, diarrhea, or any other complaints.   Past Medical History:  Diagnosis Date  . Dysmenorrhea     Patient Active Problem List   Diagnosis Date Noted  . Dysmenorrhea 05/27/2012    History reviewed. No pertinent surgical history.   OB History    Gravida  0   Para      Term      Preterm      AB      Living        SAB      TAB      Ectopic      Multiple      Live Births              Family History  Problem Relation Age of Onset  . Diabetes Paternal Grandmother   . Alcohol abuse Neg Hx   . Arthritis Neg Hx   . Asthma Neg Hx   . Birth defects Neg Hx   . Cancer Neg Hx   . COPD Neg Hx   . Depression Neg Hx   . Drug abuse Neg Hx   . Early death Neg Hx   . Hearing loss Neg Hx   . Heart disease Neg Hx   . Hyperlipidemia Neg Hx   . Hypertension Neg Hx   . Kidney disease Neg Hx   . Learning disabilities Neg Hx   . Mental illness Neg Hx   . Mental retardation Neg Hx   . Miscarriages / Stillbirths Neg Hx   . Stroke Neg Hx   . Vision loss Neg Hx     Social History   Tobacco Use  . Smoking status: Current Every Day Smoker    Types: Cigarettes  . Smokeless tobacco: Never Used  Substance Use Topics  . Alcohol use: No    . Drug use: No    Home Medications Prior to Admission medications   Medication Sig Start Date End Date Taking? Authorizing Provider  albuterol (VENTOLIN HFA) 108 (90 Base) MCG/ACT inhaler Inhale 1-2 puffs into the lungs every 6 (six) hours as needed for wheezing or shortness of breath. 07/12/20   Rhea Thrun C, PA-C  baclofen (LIORESAL) 10 MG tablet Take 1 tablet (10 mg total) by mouth 3 (three) times daily as needed for muscle spasms. 12/03/16   Palma Holter, MD  benzonatate (TESSALON) 100 MG capsule Take 1 capsule (100 mg total) by mouth every 8 (eight) hours. 07/12/20   Daltyn Degroat C, PA-C  naproxen (NAPROSYN) 500 MG tablet Take 1 tablet (500 mg total) by mouth 2 (two) times daily as needed. Take with food 12/03/16   Palma Holter, MD  ondansetron (ZOFRAN ODT) 4 MG disintegrating tablet Take 1 tablet (4 mg  total) by mouth every 8 (eight) hours as needed for nausea or vomiting. 07/12/20   Dannia Snook C, PA-C  predniSONE (STERAPRED UNI-PAK 21 TAB) 10 MG (21) TBPK tablet Take 6 tabs (60mg ) day 1, 5 tabs (50mg ) day 2, 4 tabs (40mg ) day 3, 3 tabs (30mg ) day 4, 2 tabs (20mg ) day 5, and 1 tab (10mg ) day 6. 07/12/20   Filippa Yarbough C, PA-C    Allergies    Patient has no known allergies.  Review of Systems   Review of Systems  Constitutional: Positive for fever.  Respiratory: Positive for cough and shortness of breath.   Cardiovascular: Negative for leg swelling.  Gastrointestinal: Positive for nausea. Negative for abdominal pain, diarrhea and vomiting.  Musculoskeletal: Positive for myalgias.  Neurological: Negative for dizziness and syncope.  All other systems reviewed and are negative.   Physical Exam Updated Vital Signs BP (!) 125/95   Pulse 85   Temp 99.1 F (37.3 C) (Oral)   Resp 18   Ht 6\' 4"  (1.93 m)   Wt 76 kg   SpO2 99%   BMI 20.40 kg/m   Physical Exam Vitals and nursing note reviewed.  Constitutional:      General: She is not in acute distress.    Appearance:  She is well-developed. She is not diaphoretic.  HENT:     Head: Normocephalic and atraumatic.     Mouth/Throat:     Mouth: Mucous membranes are moist.     Pharynx: Oropharynx is clear.  Eyes:     Conjunctiva/sclera: Conjunctivae normal.  Cardiovascular:     Rate and Rhythm: Normal rate and regular rhythm.     Pulses: Normal pulses.          Radial pulses are 2+ on the right side and 2+ on the left side.     Heart sounds: Normal heart sounds.     Comments: Tactile temperature in the extremities appropriate and equal bilaterally. Pulmonary:     Effort: Pulmonary effort is normal. No respiratory distress.     Breath sounds: Normal breath sounds.  Abdominal:     Palpations: Abdomen is soft.     Tenderness: There is no abdominal tenderness. There is no guarding.  Musculoskeletal:     Cervical back: Neck supple.     Right lower leg: No edema.     Left lower leg: No edema.  Lymphadenopathy:     Cervical: No cervical adenopathy.  Skin:    General: Skin is warm and dry.  Neurological:     Mental Status: She is alert.  Psychiatric:        Mood and Affect: Mood and affect normal.        Speech: Speech normal.        Behavior: Behavior normal.     ED Results / Procedures / Treatments   Labs (all labs ordered are listed, but only abnormal results are displayed) Labs Reviewed - No data to display  EKG EKG Interpretation  Date/Time:  Friday July 12 2020 14:23:23 EDT Ventricular Rate:  91 PR Interval:    QRS Duration: 68 QT Interval:  348 QTC Calculation: 429 R Axis:   87 Text Interpretation: Sinus rhythm Biatrial enlargement 12 Lead; Mason-Likar Confirmed by ( ) on 07/13/2020 2:06:15 PM   Radiology DG Chest 2 View  Result Date: 07/12/2020 CLINICAL DATA:  Shortness of breath. Additional provided: Patient tested positive for COVID last Friday, still experiencing shortness of breath, cough, congestion. EXAM: CHEST - 2 VIEW  COMPARISON:  Prior chest  radiographs 09/28/2011 and earlier FINDINGS: Heart size within normal limits. There is no appreciable airspace consolidation. No evidence of pleural effusion or pneumothorax. No acute bony abnormality identified. IMPRESSION: No evidence of acute cardiopulmonary abnormality. Electronically Signed   By: Jackey Loge DO   On: 07/12/2020 14:16    Procedures Procedures (including critical care time)  Medications Ordered in ED Medications - No data to display  ED Course  I have reviewed the triage vital signs and the nursing notes.  Pertinent labs & imaging results that were available during my care of the patient were reviewed by me and considered in my medical decision making (see chart for details).    MDM Rules/Calculators/A&P                          Patient presents with symptoms consistent with COVID-19 infection.  There has been no worsening in her symptoms. Patient is nontoxic appearing, afebrile, not tachycardic, not tachypneic, not hypotensive, excellent SPO2 on room air, and is in no apparent distress.   I reviewed and interpreted the patient's radiological studies. Patient symptoms are consistent with COVID-19 infection and she is still within the expected course of such an infection. The patient was given instructions for home care as well as return precautions. Patient voices understanding of these instructions, accepts the plan, and is comfortable with discharge.   Yen Wandell was evaluated in Emergency Department on 07/13/2020 for the symptoms described in the history of present illness. She was evaluated in the context of the global COVID-19 pandemic, which necessitated consideration that the patient might be at risk for infection with the SARS-CoV-2 virus that causes COVID-19. Institutional protocols and algorithms that pertain to the evaluation of patients at risk for COVID-19 are in a state of rapid change based on information released by regulatory bodies including the  CDC and federal and state organizations. These policies and algorithms were followed during the patient's care in the ED.  Final Clinical Impression(s) / ED Diagnoses Final diagnoses:  COVID-19    Rx / DC Orders ED Discharge Orders         Ordered    benzonatate (TESSALON) 100 MG capsule  Every 8 hours     Discontinue  Reprint     07/12/20 1527    albuterol (VENTOLIN HFA) 108 (90 Base) MCG/ACT inhaler  Every 6 hours PRN     Discontinue  Reprint     07/12/20 1527    predniSONE (STERAPRED UNI-PAK 21 TAB) 10 MG (21) TBPK tablet     Discontinue  Reprint     07/12/20 1527    ondansetron (ZOFRAN ODT) 4 MG disintegrating tablet  Every 8 hours PRN     Discontinue  Reprint     07/12/20 1528           Anselm Pancoast, PA-C 07/13/20 1723    Sabas Sous, MD 07/14/20 1506

## 2020-07-12 NOTE — ED Triage Notes (Signed)
Per pt, states she tested positive for covid last Friday-states still having SOB, cough, congestion

## 2020-08-20 ENCOUNTER — Other Ambulatory Visit: Payer: Self-pay

## 2020-08-20 ENCOUNTER — Ambulatory Visit (HOSPITAL_COMMUNITY)
Admission: EM | Admit: 2020-08-20 | Discharge: 2020-08-20 | Disposition: A | Discharge status: Home / Self Care | Payer: Self-pay | Attending: Urgent Care | Admitting: Urgent Care

## 2020-08-20 ENCOUNTER — Encounter (HOSPITAL_COMMUNITY): Payer: Self-pay

## 2020-08-20 DIAGNOSIS — R1084 Generalized abdominal pain: Secondary | ICD-10-CM

## 2020-08-20 DIAGNOSIS — R112 Nausea with vomiting, unspecified: Secondary | ICD-10-CM

## 2020-08-20 MED ORDER — DOXYLAMINE-PYRIDOXINE 10-10 MG PO TBEC: 1.0000 | DELAYED_RELEASE_TABLET | Freq: Two times a day (BID) | ORAL | 0 refills | Status: DC | PRN

## 2020-08-20 NOTE — ED Triage Notes (Signed)
Pt states she is pregnant, but not sure how far along she is, because she hasn't gone to first prenatal visit yet. Pt c/o N/V, HA, fatiguex5 days. Pt states she is unable to keep much food or fluids down.

## 2020-08-20 NOTE — ED Provider Notes (Signed)
[facility]   MRN: [id number] DOB: 1991-02-28  Subjective:   Mackenzie Mcknight is a 29 y.o. female presenting for 5-day history of persistent nausea, vomiting, abdominal pain, headache, fatigue.  Patient states that she is pregnant, took 2 home pregnancy tests and both were positive.  States that she has not been able to hold much down.  Denies fever, vaginal bleeding, hematemesis.  No current facility-administered medications for this encounter.  Current Outpatient Medications:  .  albuterol (VENTOLIN HFA) 108 (90 Base) MCG/ACT inhaler, Inhale 1-2 puffs into the lungs every 6 (six) hours as needed for wheezing or shortness of breath., Disp: 18 g, Rfl: 0   No Known Allergies  Past Medical History:  Diagnosis Date  . Dysmenorrhea      History reviewed. No pertinent surgical history.  Family History  Problem Relation Age of Onset  . Diabetes Paternal Grandmother   . Alcohol abuse Neg Hx   . Arthritis Neg Hx   . Asthma Neg Hx   . Birth defects Neg Hx   . Cancer Neg Hx   . COPD Neg Hx   . Depression Neg Hx   . Drug abuse Neg Hx   . Early death Neg Hx   . Hearing loss Neg Hx   . Heart disease Neg Hx   . Hyperlipidemia Neg Hx   . Hypertension Neg Hx   . Kidney disease Neg Hx   . Learning disabilities Neg Hx   . Mental illness Neg Hx   . Mental retardation Neg Hx   . Miscarriages / Stillbirths Neg Hx   . Stroke Neg Hx   . Vision loss Neg Hx     Social History   Tobacco Use  . Smoking status: Former Smoker    Types: Cigarettes  . Smokeless tobacco: Never Used  Substance Use Topics  . Alcohol use: No  . Drug use: No    ROS   Objective:   Vitals: BP 117/80   Pulse 84   Temp 99 F (37.2 C) (Oral)   Resp 16   Ht 6\' 4"  (1.93 m)   Wt 200 lb (90.7 kg)   SpO2 97%   BMI 24.34 kg/m   Physical Exam Constitutional:      General: She is not in acute distress.    Appearance: Normal appearance. She is well-developed and normal weight. She is not  ill-appearing, toxic-appearing or diaphoretic.  HENT:     Head: Normocephalic and atraumatic.     Right Ear: External ear normal.     Left Ear: External ear normal.     Nose: Nose normal.     Mouth/Throat:     Mouth: Mucous membranes are moist.     Pharynx: Oropharynx is clear.  Eyes:     General: No scleral icterus.       Right eye: No discharge.        Left eye: No discharge.     Extraocular Movements: Extraocular movements intact.     Conjunctiva/sclera: Conjunctivae normal.     Pupils: Pupils are equal, round, and reactive to light.  Cardiovascular:     Rate and Rhythm: Normal rate and regular rhythm.     Heart sounds: Normal heart sounds. No murmur heard.  No friction rub. No gallop.   Pulmonary:     Effort: Pulmonary effort is normal. No respiratory distress.     Breath sounds: Normal breath sounds. No stridor. No wheezing, rhonchi or rales.  Abdominal:  General: Bowel sounds are normal. There is no distension.     Palpations: Abdomen is soft. There is no mass.     Tenderness: There is abdominal tenderness. There is no right CVA tenderness, left CVA tenderness, guarding or rebound.  Skin:    General: Skin is warm and dry.     Coloration: Skin is not pale.     Findings: No rash.  Neurological:     General: No focal deficit present.     Mental Status: She is alert and oriented to person, place, and time.  Psychiatric:        Mood and Affect: Mood normal.        Behavior: Behavior normal.        Thought Content: Thought content normal.        Judgment: Judgment normal.     Assessment and Plan :   PDMP not reviewed this encounter.  1. Nausea and vomiting, intractability of vomiting not specified, unspecified vomiting type   2. Generalized abdominal pain     Patient was unable to provide urine sample during her visit. Offered beta hcg which we are unable to obtain.  I was informed by the lab that we do not have those test tubes.  Patient subsequently left the  clinic, states that she will go to the health department for confirmation of her pregnancy to be able to establish her care with an obstetrician or report to the maternal admission unit.  I did emphasize need for evaluation given her abdominal pain with the maternal admission unit.  Patient is agreeable to this and will go to the health department now.  In the meantime, start doxylamine pyridoxine to help with nausea and vomiting in pregnancy (confirmed with her home tests). Counseled patient on potential for adverse effects with medications prescribed/recommended today, ER and return-to-clinic precautions discussed, patient verbalized understanding.    Wallis Bamberg, PA-C 08/20/20 1214

## 2020-08-21 ENCOUNTER — Encounter (HOSPITAL_COMMUNITY): Payer: Self-pay

## 2020-08-21 ENCOUNTER — Inpatient Hospital Stay (HOSPITAL_COMMUNITY)
Admission: AD | Admit: 2020-08-21 | Discharge: 2020-08-22 | Disposition: A | Discharge status: Home / Self Care | Payer: Medicaid Other | Attending: Obstetrics & Gynecology | Admitting: Obstetrics & Gynecology

## 2020-08-21 DIAGNOSIS — O26891 Other specified pregnancy related conditions, first trimester: Secondary | ICD-10-CM | POA: Diagnosis not present

## 2020-08-21 DIAGNOSIS — Z3A01 Less than 8 weeks gestation of pregnancy: Secondary | ICD-10-CM | POA: Diagnosis not present

## 2020-08-21 DIAGNOSIS — R1013 Epigastric pain: Secondary | ICD-10-CM | POA: Insufficient documentation

## 2020-08-21 DIAGNOSIS — O219 Vomiting of pregnancy, unspecified: Secondary | ICD-10-CM | POA: Diagnosis present

## 2020-08-21 DIAGNOSIS — B9629 Other Escherichia coli [E. coli] as the cause of diseases classified elsewhere: Secondary | ICD-10-CM | POA: Insufficient documentation

## 2020-08-21 DIAGNOSIS — Z87891 Personal history of nicotine dependence: Secondary | ICD-10-CM | POA: Diagnosis not present

## 2020-08-21 DIAGNOSIS — O2341 Unspecified infection of urinary tract in pregnancy, first trimester: Secondary | ICD-10-CM | POA: Diagnosis not present

## 2020-08-21 LAB — CBC WITH DIFFERENTIAL/PLATELET
Abs Immature Granulocytes: 0.02 10*3/uL (ref 0.00–0.07)
Basophils Absolute: 0 10*3/uL (ref 0.0–0.1)
Basophils Relative: 0 %
Eosinophils Absolute: 0 10*3/uL (ref 0.0–0.5)
Eosinophils Relative: 0 %
HCT: 33.1 % — ABNORMAL LOW (ref 36.0–46.0)
Hemoglobin: 10.8 g/dL — ABNORMAL LOW (ref 12.0–15.0)
Immature Granulocytes: 0 %
Lymphocytes Relative: 16 %
Lymphs Abs: 1.6 10*3/uL (ref 0.7–4.0)
MCH: 27.9 pg (ref 26.0–34.0)
MCHC: 32.6 g/dL (ref 30.0–36.0)
MCV: 85.5 fL (ref 80.0–100.0)
Monocytes Absolute: 0.8 10*3/uL (ref 0.1–1.0)
Monocytes Relative: 8 %
Neutro Abs: 7.7 10*3/uL (ref 1.7–7.7)
Neutrophils Relative %: 76 %
Platelets: 342 10*3/uL (ref 150–400)
RBC: 3.87 MIL/uL (ref 3.87–5.11)
RDW: 18.3 % — ABNORMAL HIGH (ref 11.5–15.5)
WBC: 10.3 10*3/uL (ref 4.0–10.5)
nRBC: 0 % (ref 0.0–0.2)

## 2020-08-21 LAB — COMPREHENSIVE METABOLIC PANEL
ALT: 9 U/L (ref 0–44)
AST: 20 U/L (ref 15–41)
Albumin: 4.1 g/dL (ref 3.5–5.0)
Alkaline Phosphatase: 44 U/L (ref 38–126)
Anion gap: 11 (ref 5–15)
BUN: 8 mg/dL (ref 6–20)
CO2: 24 mmol/L (ref 22–32)
Calcium: 10.1 mg/dL (ref 8.9–10.3)
Chloride: 99 mmol/L (ref 98–111)
Creatinine, Ser: 0.74 mg/dL (ref 0.44–1.00)
GFR calc Af Amer: >60 mL/min (ref >60)
GFR calc non Af Amer: >60 mL/min (ref >60)
Glucose, Bld: 89 mg/dL (ref 70–99)
Potassium: 4.3 mmol/L (ref 3.5–5.1)
Sodium: 134 mmol/L — ABNORMAL LOW (ref 135–145)
Total Bilirubin: 0.7 mg/dL (ref 0.3–1.2)
Total Protein: 7.8 g/dL (ref 6.5–8.1)

## 2020-08-21 MED ORDER — LACTATED RINGERS IV SOLN
Freq: Once | INTRAVENOUS | Status: AC
  Filled 2020-08-21: qty 10

## 2020-08-21 MED ORDER — PROMETHAZINE HCL 25 MG/ML IJ SOLN
12.5000 mg | Freq: Once | INTRAMUSCULAR | Status: AC
  Administered 2020-08-21: 12.5 mg via INTRAVENOUS
  Filled 2020-08-21: qty 1

## 2020-08-21 MED ORDER — LACTATED RINGERS IV BOLUS
1000.0000 mL | Freq: Once | INTRAVENOUS | Status: AC
  Administered 2020-08-21: 1000 mL via INTRAVENOUS

## 2020-08-21 MED ORDER — FAMOTIDINE IN NACL 20-0.9 MG/50ML-% IV SOLN
20.0000 mg | Freq: Once | INTRAVENOUS | Status: AC
  Administered 2020-08-21: 20 mg via INTRAVENOUS
  Filled 2020-08-21: qty 50

## 2020-08-21 NOTE — MAU Provider Note (Signed)
History     CSN: [id number]  Arrival date and time: 08/21/20 2110   First Provider Initiated Contact with Patient 08/21/20 2229       Chief Complaint  Patient presents with  . Nausea  . Abdominal Pain   Mackenzie Mcknight is a 29 y.o. early pregnant female who presents to MAU with complaints of N/V and epigastric pain. Patient reports that she believes she is around [redacted] weeks pregnant, has taken HPT which were positive but has not had it confirmed because she is unable to urinate when she comes the the ED to be evaluated. Patient reports symptoms have been occurring since last Thursday, reports that she is unable to keep anything down. Reports emesis 7+ times over the past 24 hours. She reports upper abdominal pain, describes as aching and burning sensation where her food stops. She denies lower abdominal pain, vaginal bleeding or vaginal discharge.    OB History    Gravida  1   Para      Term      Preterm      AB      Living        SAB      TAB      Ectopic      Multiple      Live Births              Past Medical History:  Diagnosis Date  . Dysmenorrhea     History reviewed. No pertinent surgical history.  Family History  Problem Relation Age of Onset  . Diabetes Paternal Grandmother   . Alcohol abuse Neg Hx   . Arthritis Neg Hx   . Asthma Neg Hx   . Birth defects Neg Hx   . Cancer Neg Hx   . COPD Neg Hx   . Depression Neg Hx   . Drug abuse Neg Hx   . Early death Neg Hx   . Hearing loss Neg Hx   . Heart disease Neg Hx   . Hyperlipidemia Neg Hx   . Hypertension Neg Hx   . Kidney disease Neg Hx   . Learning disabilities Neg Hx   . Mental illness Neg Hx   . Mental retardation Neg Hx   . Miscarriages / Stillbirths Neg Hx   . Stroke Neg Hx   . Vision loss Neg Hx     Social History   Tobacco Use  . Smoking status: Former Smoker    Types: Cigarettes  . Smokeless tobacco: Never Used  Substance Use Topics  . Alcohol use: No  . Drug use: No     Allergies: No Known Allergies  Medications Prior to Admission  Medication Sig Dispense Refill Last Dose  . acetaminophen (TYLENOL) 500 MG tablet Take 500 mg by mouth every 6 (six) hours as needed for headache.   08/21/2020 at Unknown time  . albuterol (VENTOLIN HFA) 108 (90 Base) MCG/ACT inhaler Inhale 1-2 puffs into the lungs every 6 (six) hours as needed for wheezing or shortness of breath. 18 g 0 Past Week at Unknown time  . Doxylamine-Pyridoxine 10-10 MG TBEC Take 1 tablet by mouth 2 (two) times daily as needed. 60 tablet 0 08/21/2020 at Unknown time    Review of Systems  Constitutional: Negative.   Respiratory: Negative.   Cardiovascular: Negative.   Gastrointestinal: Positive for abdominal pain, nausea and vomiting.       Upper epigastric pain   Genitourinary: Positive for difficulty urinating. Negative for dysuria, flank pain,  frequency, pelvic pain, urgency, vaginal bleeding and vaginal discharge.  Musculoskeletal: Negative.   Neurological: Negative.   Psychiatric/Behavioral: Negative.    Physical Exam   Blood pressure 117/66, pulse 73, temperature 98.9 F (37.2 C), temperature source Oral, resp. rate 18, height 6\' 4"  (1.93 m), weight 89.7 kg, last menstrual period 07/07/2020, SpO2 100 %.  Physical Exam Vitals and nursing note reviewed.  HENT:     Head: Normocephalic.  Cardiovascular:     Rate and Rhythm: Normal rate and regular rhythm.  Pulmonary:     Effort: Pulmonary effort is normal. No respiratory distress.     Breath sounds: Normal breath sounds. No wheezing.  Abdominal:     General: There is no distension.     Palpations: Abdomen is soft. There is no mass.     Tenderness: There is no abdominal tenderness. There is no guarding.  Musculoskeletal:     Right lower leg: No edema.     Left lower leg: No edema.  Skin:    General: Skin is warm and dry.  Neurological:     Mental Status: She is alert and oriented to person, place, and time.  Psychiatric:         Mood and Affect: Mood normal.        Behavior: Behavior normal.        Thought Content: Thought content normal.     MAU Course  Procedures  MDM  Orders Placed This Encounter  Procedures  . Urinalysis, Routine w reflex microscopic Urine, Clean Catch  . CBC with Differential/Platelet  . Comprehensive metabolic panel  . hCG, quantitative, pregnancy  . ED EKG  . ABO/Rh  . Insert peripheral IV   Meds ordered this encounter  Medications  . lactated ringers bolus 1,000 mL  . famotidine (PEPCID) IVPB 20 mg premix  . promethazine (PHENERGAN) injection 12.5 mg  . multivitamins adult (INFUVITE ADULT) 10 mL in lactated ringers 1,000 mL infusion   ED EKG- NSR   Treatments in MAU included IV LR bolus, MVI, pepcid IV, and phenergan IV.  Reassessment @ [phone]- patient reports that she is feeling better, going to use restroom now, PO challenge to be initiated after patient returns from restroom.   Labs reviewed: Results for orders placed or performed during the hospital encounter of 08/21/20 (from the past 24 hour(s))  CBC with Differential/Platelet     Status: Abnormal   Collection Time: 08/21/20 11:09 PM  Result Value Ref Range   WBC 10.3 4.0 - 10.5 K/uL   RBC 3.87 3.87 - 5.11 MIL/uL   Hemoglobin 10.8 (L) 12.0 - 15.0 g/dL   HCT 65.733.1 (L) 36 - 46 %   MCV 85.5 80.0 - 100.0 fL   MCH 27.9 26.0 - 34.0 pg   MCHC 32.6 30.0 - 36.0 g/dL   RDW 84.618.3 (H) 96.211.5 - 95.215.5 %   Platelets 342 150 - 400 K/uL   nRBC 0.0 0.0 - 0.2 %   Neutrophils Relative % 76 %   Neutro Abs 7.7 1.7 - 7.7 K/uL   Lymphocytes Relative 16 %   Lymphs Abs 1.6 0.7 - 4.0 K/uL   Monocytes Relative 8 %   Monocytes Absolute 0.8 0 - 1 K/uL   Eosinophils Relative 0 %   Eosinophils Absolute 0.0 0 - 0 K/uL   Basophils Relative 0 %   Basophils Absolute 0.0 0 - 0 K/uL   Immature Granulocytes 0 %   Abs Immature Granulocytes 0.02 0.00 - 0.07 K/uL  Comprehensive  metabolic panel     Status: Abnormal   Collection Time: 08/21/20 11:09  PM  Result Value Ref Range   Sodium 134 (L) 135 - 145 mmol/L   Potassium 4.3 3.5 - 5.1 mmol/L   Chloride 99 98 - 111 mmol/L   CO2 24 22 - 32 mmol/L   Glucose, Bld 89 70 - 99 mg/dL   BUN 8 6 - 20 mg/dL   Creatinine, Ser 7.42 0.44 - 1.00 mg/dL   Calcium 59.5 8.9 - 63.8 mg/dL   Total Protein 7.8 6.5 - 8.1 g/dL   Albumin 4.1 3.5 - 5.0 g/dL   AST 20 15 - 41 U/L   ALT 9 0 - 44 U/L   Alkaline Phosphatase 44 38 - 126 U/L   Total Bilirubin 0.7 0.3 - 1.2 mg/dL   GFR calc non Af Amer >60 >60 mL/min   GFR calc Af Amer >60 >60 mL/min   Anion gap 11 5 - 15  hCG, quantitative, pregnancy     Status: Abnormal   Collection Time: 08/21/20 11:09 PM  Result Value Ref Range   hCG, Beta Chain, Quant, S 93,345 (H) <5 mIU/mL  ABO/Rh     Status: None   Collection Time: 08/21/20 11:14 PM  Result Value Ref Range   ABO/RH(D) A POS    No rh immune globuloin      NOT A RH IMMUNE GLOBULIN CANDIDATE, PT RH POSITIVE Performed at [facility], [address], [place], [place] [address]   Urinalysis, Routine w reflex microscopic Urine, Clean Catch     Status: Abnormal   Collection Time: 08/22/20  1:07 AM  Result Value Ref Range   Color, Urine AMBER (A) YELLOW   APPearance CLOUDY (A) CLEAR   Specific Gravity, Urine 1.020 1.005 - 1.030   pH 6.0 5.0 - 8.0   Glucose, UA NEGATIVE NEGATIVE mg/dL   Hgb urine dipstick NEGATIVE NEGATIVE   Bilirubin Urine NEGATIVE NEGATIVE   Ketones, ur 5 (A) NEGATIVE mg/dL   Protein, ur NEGATIVE NEGATIVE mg/dL   Nitrite POSITIVE (A) NEGATIVE   Leukocytes,Ua LARGE (A) NEGATIVE   RBC / HPF 0-5 0 - 5 RBC/hpf   WBC, UA 11-20 0 - 5 WBC/hpf   Bacteria, UA RARE (A) NONE SEEN   Squamous Epithelial / LPF 11-20 0 - 5   Mucus PRESENT    Patient was able to keep down crackers and gingerale. After PO challenge patient reports having a HA, rates HA 8/10.  - HA cocktail initiated   Patient reports feeling "much better" after treatments in MAU, HA down to 4/10. Patient denies  continued nausea/vomiting. Reports epigastric pain is resolved.  Urine noted positive nitrates - discussed with patient that she has UTI, Rx for treatment sent to pharmacy. Urine culture pending.   Discussed with patient plan of medication, encouraged to take phenergan first and wait 30/40 minutes prior to eating, once she eats then take duricef for UTI, patient verbalizes understanding. Discussed with patient that if phenergan does not stay down orally then can be placed other routes. Rx for phenergan, duricef and iron supplementation sent to pharmacy of choice. Discussed use of [facility] [employer] site for coupons on medication while patient does not have insurance currently.   Discussed reasons to return to MAU. Return to MAU as needed. Pt stable at time of discharge.   Assessment and Plan   1. Nausea and vomiting during pregnancy   2. Epigastric pain   3. Urinary tract infection in  mother during first trimester of pregnancy    Discharge home Make initial prenatal appointment  Return to MAU as needed for reasons discussed and/or emergencies  Rx sent to pharmacy of choice   Allergies as of 08/22/2020   No Known Allergies     Medication List    STOP taking these medications   Doxylamine-Pyridoxine 10-10 MG Tbec     TAKE these medications   acetaminophen 500 MG tablet Commonly known as: TYLENOL Take 500 mg by mouth every 6 (six) hours as needed for headache.   albuterol 108 (90 Base) MCG/ACT inhaler Commonly known as: VENTOLIN HFA Inhale 1-2 puffs into the lungs every 6 (six) hours as needed for wheezing or shortness of breath.   cefadroxil 500 MG capsule Commonly known as: DURICEF Take 1 capsule (500 mg total) by mouth 2 (two) times daily.   ferrous gluconate 324 MG tablet Commonly known as: FERGON Take 1 tablet (324 mg total) by mouth daily with breakfast.   promethazine 12.5 MG tablet Commonly known as: PHENERGAN Take 1 tablet (12.5 mg total) by mouth every 6 (six) hours as needed  for nausea or vomiting.       Sharyon Cable CNM 08/22/2020, 5:10 AM

## 2020-08-21 NOTE — MAU Note (Signed)
Pt c/o nausea and vomiting since last Thursday, reports weakness, and can not keep food or water down. Pt reports vomiting 7 times in the last 24 hours. Pt reports pain in her chest and upper abd w/ a pain rating 6/10. Pt also reports a migrane with pain rating of 9/10 that started last Friday. Denies bleeding and LOF. LMP July 07, 2020

## 2020-08-22 DIAGNOSIS — R1013 Epigastric pain: Secondary | ICD-10-CM

## 2020-08-22 DIAGNOSIS — O2341 Unspecified infection of urinary tract in pregnancy, first trimester: Secondary | ICD-10-CM

## 2020-08-22 DIAGNOSIS — O219 Vomiting of pregnancy, unspecified: Secondary | ICD-10-CM

## 2020-08-22 LAB — URINALYSIS, ROUTINE W REFLEX MICROSCOPIC
Bilirubin Urine: NEGATIVE
Glucose, UA: NEGATIVE mg/dL
Hgb urine dipstick: NEGATIVE
Ketones, ur: 5 mg/dL — AB
Nitrite: POSITIVE — AB
Protein, ur: NEGATIVE mg/dL
Specific Gravity, Urine: 1.02 (ref 1.005–1.030)
pH: 6 (ref 5.0–8.0)

## 2020-08-22 LAB — ABO/RH: ABO/RH(D): A POS

## 2020-08-22 LAB — HCG, QUANTITATIVE, PREGNANCY: hCG, Beta Chain, Quant, S: 93345 m[IU]/mL — ABNORMAL HIGH (ref <5)

## 2020-08-22 MED ORDER — FERROUS GLUCONATE 324 (38 FE) MG PO TABS: 324.0000 mg | ORAL_TABLET | Freq: Every day | ORAL | 3 refills | Status: DC

## 2020-08-22 MED ORDER — CEFADROXIL 500 MG PO CAPS: 500.0000 mg | ORAL_CAPSULE | Freq: Two times a day (BID) | ORAL | 0 refills | Status: DC

## 2020-08-22 MED ORDER — PROMETHAZINE HCL 12.5 MG PO TABS: 12.5000 mg | ORAL_TABLET | Freq: Four times a day (QID) | ORAL | 2 refills | Status: DC | PRN

## 2020-08-22 MED ORDER — DIPHENHYDRAMINE HCL 50 MG/ML IJ SOLN
25.0000 mg | Freq: Once | INTRAMUSCULAR | Status: AC
  Administered 2020-08-22: 25 mg via INTRAVENOUS
  Filled 2020-08-22: qty 1

## 2020-08-22 MED ORDER — DEXAMETHASONE SODIUM PHOSPHATE 10 MG/ML IJ SOLN
10.0000 mg | Freq: Once | INTRAMUSCULAR | Status: AC
  Administered 2020-08-22: 10 mg via INTRAVENOUS
  Filled 2020-08-22: qty 1

## 2020-08-22 MED ORDER — METOCLOPRAMIDE HCL 5 MG/ML IJ SOLN
10.0000 mg | Freq: Once | INTRAMUSCULAR | Status: AC
  Administered 2020-08-22: 10 mg via INTRAVENOUS
  Filled 2020-08-22: qty 2

## 2020-08-22 NOTE — Progress Notes (Signed)
Written and verbal d/c instructions given and understanding voiced. 

## 2020-08-24 LAB — CULTURE, OB URINE: Culture: >=100000 — AB

## 2020-08-27 ENCOUNTER — Encounter: Payer: Self-pay | Admitting: Certified Nurse Midwife

## 2020-08-27 DIAGNOSIS — O2341 Unspecified infection of urinary tract in pregnancy, first trimester: Secondary | ICD-10-CM | POA: Insufficient documentation

## 2020-09-09 ENCOUNTER — Ambulatory Visit (INDEPENDENT_AMBULATORY_CARE_PROVIDER_SITE_OTHER): Payer: Self-pay | Admitting: *Deleted

## 2020-09-09 ENCOUNTER — Encounter: Payer: Self-pay | Admitting: General Practice

## 2020-09-09 DIAGNOSIS — R519 Headache, unspecified: Secondary | ICD-10-CM

## 2020-09-09 DIAGNOSIS — Z3400 Encounter for supervision of normal first pregnancy, unspecified trimester: Secondary | ICD-10-CM

## 2020-09-09 DIAGNOSIS — Z3A09 9 weeks gestation of pregnancy: Secondary | ICD-10-CM

## 2020-09-09 DIAGNOSIS — O219 Vomiting of pregnancy, unspecified: Secondary | ICD-10-CM

## 2020-09-09 DIAGNOSIS — O2341 Unspecified infection of urinary tract in pregnancy, first trimester: Secondary | ICD-10-CM

## 2020-09-09 DIAGNOSIS — R110 Nausea: Secondary | ICD-10-CM

## 2020-09-09 HISTORY — DX: Encounter for supervision of normal first pregnancy, unspecified trimester: Z34.00

## 2020-09-09 MED ORDER — CITRANATAL ASSURE 35-1 & 300 MG PO MISC: 1.0000 | Freq: Every day | ORAL | 0 refills | Status: DC

## 2020-09-09 NOTE — Progress Notes (Signed)
  Virtual Visit via Telephone Note  I connected with Mackenzie Mcknight on 09/09/20 at 11:10 AM EDT by telephone and verified that I am speaking with the correct person using two identifiers.  Location: [facility] Patient: Mackenzie Mcknight MRN: [id number] Provider: Clovis Pu, RN   I discussed the limitations, risks, security and privacy concerns of performing an evaluation and management service by telephone and the availability of in person appointments. I also discussed with the patient that there may be a patient responsible charge related to this service. The patient expressed understanding and agreed to proceed.   History of Present Illness: PRENATAL INTAKE SUMMARY  [name] presents today New OB Nurse Interview.  OB History    Gravida  1   Para      Term      Preterm      AB      Living        SAB      TAB      Ectopic      Multiple      Live Births             I have reviewed the patient's medical, obstetrical, social, and family histories, medications, and available lab results.  SUBJECTIVE She complains of headache and nausea with vomiting. Reported having headaches everyday along with n/v. Stated that Phenergan really does not help with n/v.   Observations/Objective: Initial nurse interview for history/labs (New OB)  EDD: 04/13/2021 by LMP GA: [redacted]w[redacted]d G1P0 FHT: non face to face interview  GENERAL APPEARANCE: non face to face interview  Assessment and Plan: Normal pregnancy Prenatal care-[facility] Labs to be Completed at next visit with Steward Drone, CNM 10/02/20 Advised patient to go to MAU for evaluation of headaches/nausea. Advised to try Vitamin B6 100 mg BID, Dramamine, Meclizine or Unisom over the counter for n/v; Take 2 extra strength Tylenol every 6 hours for headaches.  Patient to sign up for Mychart Once insurance is approved, Rx for BP monitor and weight scale will be sent to Pharmacy Sample of CitraNatal Assure 30 day  supply (1 box/5 tablets)  Follow Up Instructions:   I discussed the assessment and treatment plan with the patient. The patient was provided an opportunity to ask questions and all were answered. The patient agreed with the plan and demonstrated an understanding of the instructions.   The patient was advised to call back or seek an in-person evaluation if the symptoms worsen or if the condition fails to improve as anticipated.  I provided 30 minutes of non-face-to-face time during this encounter.   Clovis Pu, RN

## 2020-09-24 ENCOUNTER — Telehealth: Payer: Self-pay | Admitting: General Practice

## 2020-09-24 NOTE — Telephone Encounter (Signed)
Patient left message wanting to reschedule NOB appt.  Called patient to reschedule, but no answer.  Left message on VM for pt to give our office a call.

## 2020-10-02 ENCOUNTER — Encounter: Payer: Self-pay | Admitting: Certified Nurse Midwife

## 2020-10-16 ENCOUNTER — Encounter: Payer: Self-pay | Admitting: General Practice

## 2020-10-30 ENCOUNTER — Other Ambulatory Visit: Payer: Self-pay

## 2020-10-30 ENCOUNTER — Ambulatory Visit (INDEPENDENT_AMBULATORY_CARE_PROVIDER_SITE_OTHER): Payer: Self-pay | Admitting: Certified Nurse Midwife

## 2020-10-30 ENCOUNTER — Other Ambulatory Visit (HOSPITAL_COMMUNITY)
Admission: RE | Admit: 2020-10-30 | Discharge: 2020-10-30 | Disposition: A | Discharge status: Home / Self Care | Payer: Medicaid Other | Source: Ambulatory Visit | Attending: Certified Nurse Midwife | Admitting: Certified Nurse Midwife

## 2020-10-30 VITALS — BP 107/73 | HR 82 | Temp 98.4°F | Wt 192.0 lb

## 2020-10-30 DIAGNOSIS — Z124 Encounter for screening for malignant neoplasm of cervix: Secondary | ICD-10-CM | POA: Diagnosis present

## 2020-10-30 DIAGNOSIS — K219 Gastro-esophageal reflux disease without esophagitis: Secondary | ICD-10-CM

## 2020-10-30 DIAGNOSIS — O219 Vomiting of pregnancy, unspecified: Secondary | ICD-10-CM

## 2020-10-30 DIAGNOSIS — Z3400 Encounter for supervision of normal first pregnancy, unspecified trimester: Secondary | ICD-10-CM | POA: Insufficient documentation

## 2020-10-30 DIAGNOSIS — Z3A16 16 weeks gestation of pregnancy: Secondary | ICD-10-CM

## 2020-10-30 MED ORDER — FAMOTIDINE 20 MG PO TABS: 20.0000 mg | ORAL_TABLET | Freq: Two times a day (BID) | ORAL | 2 refills | Status: DC

## 2020-10-30 MED ORDER — PROMETHAZINE HCL 25 MG PO TABS: 25.0000 mg | ORAL_TABLET | Freq: Four times a day (QID) | ORAL | 1 refills | Status: DC | PRN

## 2020-10-30 NOTE — Patient Instructions (Addendum)
Genetic Screening Results Information: You are having genetic testing called [facility] today.  It will take approximately 2 weeks before the results are available.  To get your results, you need Internet access to a web browser to search [place]/MyChart (the direct app on your phone will not give you these results).  Then select Lab Scanned and click on the blue hyper link that says View Image to see your [facility] results.  You can also use the directions on the purple card given to look up your results directly on the [employer] website.   How to Take Your Blood Pressure You can take your blood pressure at home with a machine. You may need to check your blood pressure at home:  To check if you have high blood pressure (hypertension).  To check your blood pressure over time.  To make sure your blood pressure medicine is working. Supplies needed: You will need a blood pressure machine, or monitor. You can buy one at a drugstore or online. When choosing one:  Choose one with an arm cuff.  Choose one that wraps around your upper arm. Only one finger should fit between your arm and the cuff.  Do not choose one that measures your blood pressure from your wrist or finger. Your doctor can suggest a monitor. How to prepare Avoid these things for 30 minutes before checking your blood pressure:  Drinking caffeine.  Drinking alcohol.  Eating.  Smoking.  Exercising. Five minutes before checking your blood pressure:  Pee.  Sit in a dining chair. Avoid sitting in a soft couch or armchair.  Be quiet. Do not talk. How to take your blood pressure Follow the instructions that came with your machine. If you have a digital blood pressure monitor, these may be the instructions: 1. Sit up straight. 2. Place your feet on the floor. Do not cross your ankles or legs. 3. Rest your left arm at the level of your heart. You may rest it on a table, desk, or chair. 4. Pull up your shirt  sleeve. 5. Wrap the blood pressure cuff around the upper part of your left arm. The cuff should be 1 inch (2.5 cm) above your elbow. It is best to wrap the cuff around bare skin. 6. Fit the cuff snugly around your arm. You should be able to place only one finger between the cuff and your arm. 7. Put the cord inside the groove of your elbow. 8. Press the power button. 9. Sit quietly while the cuff fills with air and loses air. 10. Write down the numbers on the screen. 11. Wait 2-3 minutes and then repeat steps 1-10. What do the numbers mean? Two numbers make up your blood pressure. The first number is called systolic pressure. The second is called diastolic pressure. An example of a blood pressure reading is "120 over 80" (or 120/80). If you are an adult and do not have a medical condition, use this guide to find out if your blood pressure is normal: Normal  First number: below 120.  Second number: below 80. Elevated  First number: 120-129.  Second number: below 80. Hypertension stage 1  First number: 130-139.  Second number: 80-89. Hypertension stage 2  First number: 140 or above.  Second number: 90 or above. Your blood pressure is above normal even if only the top or bottom number is above normal. Follow these instructions at home:  Check your blood pressure as often as your doctor tells you to.  Take  your monitor to your next doctor's appointment. Your doctor will: ? Make sure you are using it correctly. ? Make sure it is working right.  Make sure you understand what your blood pressure numbers should be.  Tell your doctor if your medicines are causing side effects. Contact a doctor if:  Your blood pressure keeps being high. Get help right away if:  Your first blood pressure number is higher than 180.  Your second blood pressure number is higher than 120. This information is not intended to replace advice given to you by your health care provider. Make sure you  discuss any questions you have with your health care provider. Document Revised: 11/12/2017 Document Reviewed: 05/08/2016 Elsevier Patient Education  2020 [employer]  Management of Nausea/Vomiting 1. Begin taking 1 Unisom tablet (doxylamine succinate), a phenergan tablet, and a pepcid at bedtime.  2. Take a pepcid tablet and a B6 tablet in the morning, and then another B6 tablet at lunch and dinner. If the nausea increases, back down to B6 twice a day.   3. STOP taking/using all ginger products.   First Trimester of Pregnancy  The first trimester of pregnancy is from week 1 until the end of week 13 (months 1 through 3). During this time, your baby will begin to develop inside you. At 6-8 weeks, the eyes and face are formed, and the heartbeat can be seen on ultrasound. At the end of 12 weeks, all the baby's organs are formed. Prenatal care is all the medical care you receive before the birth of your baby. Make sure you get good prenatal care and follow all of your doctor's instructions. Follow these instructions at home: Medicines  Take over-the-counter and prescription medicines only as told by your doctor. Some medicines are safe and some medicines are not safe during pregnancy.  Take a prenatal vitamin that contains at least 600 micrograms (mcg) of folic acid.  If you have trouble pooping (constipation), take medicine that will make your stool soft (stool softener) if your doctor approves. Eating and drinking   Eat regular, healthy meals.  Your doctor will tell you the amount of weight gain that is right for you.  Avoid raw meat and uncooked cheese.  If you feel sick to your stomach (nauseous) or throw up (vomit): ? Eat 4 or 5 small meals a day instead of 3 large meals. ? Try eating a few soda crackers. ? Drink liquids between meals instead of during meals.  To prevent constipation: ? Eat foods that are high in fiber, like fresh fruits and vegetables, whole grains, and  beans. ? Drink enough fluids to keep your pee (urine) clear or pale yellow. Activity  Exercise only as told by your doctor. Stop exercising if you have cramps or pain in your lower belly (abdomen) or low back.  Do not exercise if it is too hot, too humid, or if you are in a place of great height (high altitude).  Try to avoid standing for long periods of time. Move your legs often if you must stand in one place for a long time.  Avoid heavy lifting.  Wear low-heeled shoes. Sit and stand up straight.  You can have sex unless your doctor tells you not to. Relieving pain and discomfort  Wear a good support bra if your breasts are sore.  Take warm water baths (sitz baths) to soothe pain or discomfort caused by hemorrhoids. Use hemorrhoid cream if your doctor says it is okay.  Rest with  your legs raised if you have leg cramps or low back pain.  If you have puffy, bulging veins (varicose veins) in your legs: ? Wear support hose or compression stockings as told by your doctor. ? Raise (elevate) your feet for 15 minutes, 3-4 times a day. ? Limit salt in your food. Prenatal care  Schedule your prenatal visits by the twelfth week of pregnancy.  Write down your questions. Take them to your prenatal visits.  Keep all your prenatal visits as told by your doctor. This is important. Safety  Wear your seat belt at all times when driving.  Make a list of emergency phone numbers. The list should include numbers for family, friends, the hospital, and police and fire departments. General instructions  Ask your doctor for a referral to a local prenatal class. Begin classes no later than at the start of month 6 of your pregnancy.  Ask for help if you need counseling or if you need help with nutrition. Your doctor can give you advice or tell you where to go for help.  Do not use hot tubs, steam rooms, or saunas.  Do not douche or use tampons or scented sanitary pads.  Do not cross your legs  for long periods of time.  Avoid all herbs and alcohol. Avoid drugs that are not approved by your doctor.  Do not use any tobacco products, including cigarettes, chewing tobacco, and electronic cigarettes. If you need help quitting, ask your doctor. You may get counseling or other support to help you quit.  Avoid cat litter boxes and soil used by cats. These carry germs that can cause birth defects in the baby and can cause a loss of your baby (miscarriage) or stillbirth.  Visit your dentist. At home, brush your teeth with a soft toothbrush. Be gentle when you floss. Contact a doctor if:  You are dizzy.  You have mild cramps or pressure in your lower belly.  You have a nagging pain in your belly area.  You continue to feel sick to your stomach, you throw up, or you have watery poop (diarrhea).  You have a bad smelling fluid coming from your vagina.  You have pain when you pee (urinate).  You have increased puffiness (swelling) in your face, hands, legs, or ankles. Get help right away if:  You have a fever.  You are leaking fluid from your vagina.  You have spotting or bleeding from your vagina.  You have very bad belly cramping or pain.  You gain or lose weight rapidly.  You throw up blood. It may look like coffee grounds.  You are around people who have [place] measles, fifth disease, or chickenpox.  You have a very bad headache.  You have shortness of breath.  You have any kind of trauma, such as from a fall or a car accident. Summary  The first trimester of pregnancy is from week 1 until the end of week 13 (months 1 through 3).  To take care of yourself and your unborn baby, you will need to eat healthy meals, take medicines only if your doctor tells you to do so, and do activities that are safe for you and your baby.  Keep all follow-up visits as told by your doctor. This is important as your doctor will have to ensure that your baby is healthy and growing  well. This information is not intended to replace advice given to you by your health care provider. Make sure you discuss any questions  you have with your health care provider. Document Revised: 03/23/2019 Document Reviewed: 12/08/2016 Elsevier Patient Education  2020 [employer].

## 2020-10-31 LAB — HCV INTERPRETATION

## 2020-10-31 LAB — CBC/D/PLT+RPR+RH+ABO+RUB AB...
Antibody Screen: NEGATIVE
Basophils Absolute: 0 10*3/uL (ref 0.0–0.2)
Basos: 0 %
EOS (ABSOLUTE): 0 10*3/uL (ref 0.0–0.4)
Eos: 0 %
HCV Ab: <0.1 s/co ratio (ref 0.0–0.9)
HIV Screen 4th Generation wRfx: NONREACTIVE
Hematocrit: 31.2 % — ABNORMAL LOW (ref 34.0–46.6)
Hemoglobin: 10.7 g/dL — ABNORMAL LOW (ref 11.1–15.9)
Hepatitis B Surface Ag: NEGATIVE
Immature Grans (Abs): 0 10*3/uL (ref 0.0–0.1)
Immature Granulocytes: 0 %
Lymphocytes Absolute: 1.6 10*3/uL (ref 0.7–3.1)
Lymphs: 16 %
MCH: 30.1 pg (ref 26.6–33.0)
MCHC: 34.3 g/dL (ref 31.5–35.7)
MCV: 88 fL (ref 79–97)
Monocytes Absolute: 0.7 10*3/uL (ref 0.1–0.9)
Monocytes: 7 %
Neutrophils Absolute: 7.4 10*3/uL — ABNORMAL HIGH (ref 1.4–7.0)
Neutrophils: 77 %
Platelets: 295 10*3/uL (ref 150–450)
RBC: 3.55 x10E6/uL — ABNORMAL LOW (ref 3.77–5.28)
RDW: 15.8 % — ABNORMAL HIGH (ref 11.7–15.4)
RPR Ser Ql: NONREACTIVE
Rh Factor: POSITIVE
Rubella Antibodies, IGG: 11.3 index (ref >0.99)
WBC: 9.8 10*3/uL (ref 3.4–10.8)

## 2020-11-01 ENCOUNTER — Encounter: Payer: Self-pay | Admitting: General Practice

## 2020-11-01 LAB — CYTOLOGY - PAP
Chlamydia: NEGATIVE
Comment: NEGATIVE
Comment: NORMAL
Diagnosis: NEGATIVE
Neisseria Gonorrhea: NEGATIVE

## 2020-11-01 NOTE — Progress Notes (Signed)
History:   Mackenzie Mcknight is a 29 y.o. G1P0 at [redacted]w[redacted]d by LMP being seen today for her first obstetrical visit.  Her obstetrical history is significant for HEG, rehydrated in MAU twice, still experiencing symptoms. Patient does intend to breast feed. Pregnancy history fully reviewed.  Patient reports heartburn, nausea and vomiting. There are very few things she can keep down, but is able to stay fairly hydrated. The amount of vomiting has kept her out of work and is affecting her quality of life/making her feel depressed. Also having some lower abdominal cramping that is aggravated by movement, worse when vomiting, coughing, laughing, or turning over in bed.    HISTORY: OB History  Gravida Para Term Preterm AB Living  1 0 0 0 0 0  SAB TAB Ectopic Multiple Live Births  0 0 0 0 0    # Outcome Date GA Lbr Len/2nd Weight Sex Delivery Anes PTL Lv  1 Current             Last pap smear date is unknown ("it's been awhile"), collected today.  Past Medical History:  Diagnosis Date  . Dysmenorrhea   . Headache    Past Surgical History:  Procedure Laterality Date  . NO PAST SURGERIES     Family History  Problem Relation Age of Onset  . Diabetes Paternal Grandmother   . Alcohol abuse Neg Hx   . Arthritis Neg Hx   . Asthma Neg Hx   . Birth defects Neg Hx   . Cancer Neg Hx   . COPD Neg Hx   . Depression Neg Hx   . Drug abuse Neg Hx   . Early death Neg Hx   . Hearing loss Neg Hx   . Heart disease Neg Hx   . Hyperlipidemia Neg Hx   . Hypertension Neg Hx   . Kidney disease Neg Hx   . Learning disabilities Neg Hx   . Mental illness Neg Hx   . Mental retardation Neg Hx   . Miscarriages / Stillbirths Neg Hx   . Stroke Neg Hx   . Vision loss Neg Hx    Social History   Tobacco Use  . Smoking status: Former Smoker    Types: Cigarettes  . Smokeless tobacco: Never Used  Vaping Use  . Vaping Use: Never used  Substance Use Topics  . Alcohol use: No  . Drug use: No   Allergies    Allergen Reactions  . Peanut-Containing Drug Products Anaphylaxis    Wheezing, Throat swelling   Current Outpatient Medications on File Prior to Visit  Medication Sig Dispense Refill  . acetaminophen (TYLENOL) 500 MG tablet Take 500 mg by mouth every 6 (six) hours as needed for headache.    . albuterol (VENTOLIN HFA) 108 (90 Base) MCG/ACT inhaler Inhale 1-2 puffs into the lungs every 6 (six) hours as needed for wheezing or shortness of breath. 18 g 0  . Prenat w/o A-[id number]-DHA (CITRANATAL ASSURE) 35-1 & 300 MG tablet Take 1 tablet by mouth daily. 6 each 0   No current facility-administered medications on file prior to visit.    Review of Systems Pertinent items noted in HPI and remainder of comprehensive ROS otherwise negative.  Physical Exam:   Vitals:   10/30/20 1446  BP: 107/73  Pulse: 82  Temp: 98.4 F (36.9 C)  Weight: 192 lb (87.1 kg)   Fetal Heart Rate (bpm): 155  General: well-developed, well-nourished female in no acute distress  Breasts:  normal  appearance, no masses or tenderness bilaterally  Skin: normal coloration and turgor, no rashes  Neurologic: oriented, normal, negative, normal mood  Extremities: normal strength, tone, and muscle mass, ROM of all joints is normal  HEENT PERRLA, extraocular movement intact and sclera clear, anicteric  Neck supple and no masses  Cardiovascular: regular rate and rhythm  Respiratory:  no respiratory distress, normal breath sounds  Abdomen: soft, non-tender; bowel sounds normal; no masses,  no organomegaly  Pelvic: normal external genitalia, no lesions, normal vaginal mucosa, normal vaginal discharge, normal cervix, pap smear done. Uterine size=dates    Assessment:    Pregnancy: G1P0 Patient Active Problem List   Diagnosis Date Noted  . Supervision of normal first pregnancy, antepartum 09/09/2020  . Urinary tract infection affecting care of mother in first trimester, antepartum 08/27/2020  . Dysmenorrhea 05/27/2012      Plan:    1. Supervision of normal first pregnancy, antepartum - CBC/D/Plt+RPR+Rh+ABO+Rub Ab... - Genetic Screening - AFP, Serum, Open Spina Bifida - Cytology - PAP( [place]) - Hemoglobin A1c  2. Pap smear for cervical cancer screening - Cytology - PAP( [place])  3. Nausea and vomiting during pregnancy - promethazine (PHENERGAN) 25 MG tablet; Take 1 tablet (25 mg total) by mouth every 6 (six) hours as needed for nausea or vomiting.  Dispense: 30 tablet; Refill: 1 - Referral to SW made, Gwyndolyn Saxon, SW able to meet pt at this visit, pt scheduled follow up with her  4. [redacted] weeks gestation of pregnancy - Will try prenatal gummies when she can keep down food - FMLA paperwork given to RN for completion - Demonstrated round ligament massage and stretches to relieve pain - Problem list reviewed and updated. - Genetic Screening discussed and ordered - Ultrasound discussed; fetal anatomic survey ordered/scheduled - Anticipatory guidance about prenatal visits given including labs, ultrasounds, and testing. - Discussed usage of Babyscripts and virtual visits as additional source of managing and completing prenatal visits in midst of coronavirus and pandemic.   - Encouraged to complete MyChart Registration for her ability to review results, send requests, and have questions addressed.  - The nature of [place] [facility] Healthcare/Faculty Practice with multiple MDs and Advanced Practice Providers was explained to patient; also emphasized that residents, students are part of our team. - Routine obstetric precautions reviewed. Encouraged to seek out care at office or emergency room [facility] MAU preferred) for urgent and/or emergent concerns.  5. Gastroesophageal reflux disease without esophagitis - famotidine (PEPCID) 20 MG tablet; Take 1 tablet (20 mg total) by mouth 2 (two) times daily.  Dispense: 60 tablet; Refill: 2  Return in about 4 weeks (around 11/27/2020) for  VIRTUAL, LOB.    Edd Arbour, CNM, MSN, [facility] 10/30/20 1700

## 2020-11-08 LAB — HEMOGLOBIN A1C
Est. average glucose Bld gHb Est-mCnc: 80 mg/dL
Hgb A1c MFr Bld: 4.4 % — ABNORMAL LOW (ref 4.8–5.6)

## 2020-11-08 LAB — AFP, SERUM, OPEN SPINA BIFIDA
AFP MoM: 2.11
AFP Value: 67 ng/mL
Gest. Age on Collection Date: 16 weeks
Maternal Age At EDD: 27.4 yr
OSBR Risk 1 IN: 1267
Test Results:: NEGATIVE
Weight: 192 [lb_av]

## 2020-11-12 ENCOUNTER — Encounter: Payer: Self-pay | Admitting: General Practice

## 2020-11-12 ENCOUNTER — Telehealth: Payer: Self-pay | Admitting: General Practice

## 2020-11-12 NOTE — Telephone Encounter (Signed)
Left message on VM informing patient of [place] with [facility] ([facility]) on 11/18/2020 at 11:30am.  Asked patient to give our office a call with any questions or concerns.

## 2020-11-18 ENCOUNTER — Telehealth: Payer: Self-pay | Admitting: General Practice

## 2020-11-18 NOTE — Telephone Encounter (Signed)
Pt called and stated that [facility] called to reschedule [place] appt.  [place] scheduled on Thursday, 12/23/2019 at 1130.

## 2020-11-18 NOTE — Telephone Encounter (Signed)
Correction from last note.  Pt called and stated that [facility] called to reschedule [place] appt.  [place] scheduled on Thursday, 11/21/2020 at 1130.

## 2020-11-26 ENCOUNTER — Encounter: Payer: Self-pay | Admitting: General Practice

## 2020-11-27 ENCOUNTER — Encounter: Payer: Medicaid Other | Admitting: Certified Nurse Midwife

## 2020-12-05 ENCOUNTER — Other Ambulatory Visit: Payer: Self-pay

## 2020-12-05 ENCOUNTER — Ambulatory Visit (INDEPENDENT_AMBULATORY_CARE_PROVIDER_SITE_OTHER): Payer: Self-pay | Admitting: Advanced Practice Midwife

## 2020-12-05 VITALS — BP 113/83 | HR 97 | Temp 97.7°F | Wt 198.8 lb

## 2020-12-05 DIAGNOSIS — Z3A21 21 weeks gestation of pregnancy: Secondary | ICD-10-CM

## 2020-12-05 DIAGNOSIS — Z3400 Encounter for supervision of normal first pregnancy, unspecified trimester: Secondary | ICD-10-CM

## 2020-12-05 DIAGNOSIS — O219 Vomiting of pregnancy, unspecified: Secondary | ICD-10-CM

## 2020-12-05 DIAGNOSIS — O2341 Unspecified infection of urinary tract in pregnancy, first trimester: Secondary | ICD-10-CM

## 2020-12-05 NOTE — Patient Instructions (Signed)

## 2020-12-05 NOTE — Progress Notes (Signed)
   PRENATAL VISIT NOTE  Subjective:  Mackenzie Mcknight is a 29 y.o. G1P0 at [redacted]w[redacted]d being seen today for ongoing prenatal care.  She is currently monitored for the following issues for this low-risk pregnancy and has Dysmenorrhea; Urinary tract infection affecting care of mother in first trimester, antepartum; and Supervision of normal first pregnancy, antepartum on their problem list.  Patient reports no complaints. Nausea and vomiting improved with previously prescribed medications. Contractions: Not present. Vag. Bleeding: None.  Movement: Present. Denies leaking of fluid.   The following portions of the patient's history were reviewed and updated as appropriate: allergies, current medications, past family history, past medical history, past social history, past surgical history and problem list. Problem list updated.  Objective:   Vitals:   12/05/20 0954  BP: 113/83  Pulse: 97  Temp: 97.7 F (36.5 C)  Weight: 198 lb 12.8 oz (90.2 kg)    Fetal Status: Fetal Heart Rate (bpm): 146   Movement: Present     General:  Alert, oriented and cooperative. Patient is in no acute distress.  Skin: Skin is warm and dry. No rash noted.   Cardiovascular: Normal heart rate noted  Respiratory: Normal respiratory effort, no problems with respiration noted  Abdomen: Soft, gravid, appropriate for gestational age.  Pain/Pressure: Absent     Pelvic: Cervical exam deferred        Extremities: Normal range of motion.  Edema: None  Mental Status: Normal mood and affect. Normal behavior. Normal judgment and thought content.   Assessment and Plan:  Pregnancy: G1P0 at [redacted]w[redacted]d  1. Supervision of normal first pregnancy, antepartum - LOB, no complaints - Incomplete fetal anatomy at initial scan. Needs repeat around mid-Jan 2022. Reassured this is typically fetal positioning and appointment duration, not a sign anything is wrong - Contraception counseling. Does not desire more children. Referred to [email or web address] for  consideration of alternative  2. Urinary tract infection affecting care of mother in first trimester, antepartum - No TOC from 08/2020 - Consist difficulty voiding in medical offices - Given water and waited about one hour, unable to void - Sent home with urine cup, plans to return later today - Culture, OB Urine  3. Nausea and vomiting during pregnancy - Managed  4. [redacted] weeks gestation of pregnancy   Preterm labor symptoms and general obstetric precautions including but not limited to vaginal bleeding, contractions, leaking of fluid and fetal movement were reviewed in detail with the patient. Please refer to After Visit Summary for other counseling recommendations.  Return in about 4 weeks (around 01/02/2021).  Future Appointments  Date Time Provider Department Center  01/02/2021  1:30 PM Raelyn Mora, CNM [facility] None    [place], [place]

## 2020-12-08 LAB — URINE CULTURE, OB REFLEX

## 2020-12-08 LAB — CULTURE, OB URINE

## 2020-12-09 ENCOUNTER — Other Ambulatory Visit: Payer: Self-pay | Admitting: Advanced Practice Midwife

## 2020-12-09 DIAGNOSIS — O2342 Unspecified infection of urinary tract in pregnancy, second trimester: Secondary | ICD-10-CM

## 2020-12-09 MED ORDER — AMOXICILLIN 500 MG PO CAPS: 500.0000 mg | ORAL_CAPSULE | Freq: Three times a day (TID) | ORAL | 0 refills | Status: AC

## 2020-12-10 ENCOUNTER — Telehealth: Payer: Self-pay

## 2020-12-10 NOTE — Telephone Encounter (Signed)
Called patient with her [place] appointment that is on 12/23/2020 at 9:30am. Patient did not answer and left a detailed VM.

## 2020-12-11 ENCOUNTER — Telehealth: Payer: Self-pay | Admitting: *Deleted

## 2020-12-11 NOTE — Telephone Encounter (Signed)
-----   Message from [place], [place] sent at 12/08/2020  6:38 PM EST ----- Regarding: FW: UTI please send Amoxicillin 500mg  TID x 7 days. If patient asks there are other drugs on culture that show as susceptible but Up to Date says cillins are preferred treatment. Thanks! SW, CNM ----- Message ----- From: Interface, [facility] Lab Results In Sent: 12/07/2020   2:35 PM EST To: 12/09/2020, CNM

## 2020-12-14 NOTE — L&D Delivery Note (Signed)
Shoulder Dystocia Delivery Note Pt pushed well x 18 minutes. NICU team was called in advance due to recurrent variable decels. At 8:38 AM a viable and healthy female was delivered via Vaginal, Spontaneous.  Presentation: vertex; Position: Right,, Occiput,, Anterior after a 2 minute shoulder dystocia. After delivery of body was not achieved w/ gentle downward traction on fetal head, a shoulder dystocia was called. McRoberts, and Suprapubic pressure were performed body still could not be delivered. Dr. Donavan Foil, attending was called but CNM was able to deliver baby by hooking posterior (right) axilla, rotating it clockwise and applying traction. Baby was placed skin-to-skin w/ mom. Had poor tone and color immediately after delivery but responded quickly to drying and stimulation on mother's abdomen and did not need NICU eval. Delayed cord clamping. Cord clamped x 2 and cut by pt's mother.   Delivery of the head: 04/05/2021  8:36 AM First maneuver: 04/05/2021  8:36 AM, McRoberts Second maneuver: 04/05/2021  8:37 AM, Suprapubic Pressure Third maneuver: 04/05/2021  8:38 AM,  Delivery of posterior (right) arm  APGAR: 8, 9; weight pending.   Placenta status: spontaneous, intact w/ trailing membranes.   Cord:  with the following complications: marginal insertion .  Cord pH: NA  Anesthesia: Epidural Episiotomy: None Lacerations: Labial;1st degree Suture Repair: NA. Hemostatic, no repair.  Est. Blood Loss (mL): 400  Mom to postpartum.  Baby to Couplet care / Skin to Skin. Placenta to: LD Feeding: Breast Circ: NA Contraception: POPs  [place] 04/05/2021, 9:27 AM

## 2020-12-31 ENCOUNTER — Encounter: Payer: Self-pay | Admitting: *Deleted

## 2021-01-02 ENCOUNTER — Encounter: Payer: Medicaid Other | Admitting: Obstetrics and Gynecology

## 2021-01-06 ENCOUNTER — Encounter: Payer: Self-pay | Admitting: *Deleted

## 2021-01-23 ENCOUNTER — Ambulatory Visit (INDEPENDENT_AMBULATORY_CARE_PROVIDER_SITE_OTHER): Payer: Medicaid Other | Admitting: Obstetrics and Gynecology

## 2021-01-23 ENCOUNTER — Encounter: Payer: Self-pay | Admitting: Obstetrics and Gynecology

## 2021-01-23 ENCOUNTER — Other Ambulatory Visit (HOSPITAL_COMMUNITY)
Admission: RE | Admit: 2021-01-23 | Discharge: 2021-01-23 | Disposition: A | Discharge status: Home / Self Care | Payer: Medicaid Other | Source: Ambulatory Visit | Attending: Obstetrics and Gynecology | Admitting: Obstetrics and Gynecology

## 2021-01-23 ENCOUNTER — Other Ambulatory Visit: Payer: Self-pay

## 2021-01-23 VITALS — BP 134/74 | HR 80 | Temp 98.4°F | Wt 213.2 lb

## 2021-01-23 DIAGNOSIS — O26853 Spotting complicating pregnancy, third trimester: Secondary | ICD-10-CM

## 2021-01-23 DIAGNOSIS — N888 Other specified noninflammatory disorders of cervix uteri: Secondary | ICD-10-CM

## 2021-01-23 DIAGNOSIS — Z3A28 28 weeks gestation of pregnancy: Secondary | ICD-10-CM

## 2021-01-23 DIAGNOSIS — Z3400 Encounter for supervision of normal first pregnancy, unspecified trimester: Secondary | ICD-10-CM

## 2021-01-23 DIAGNOSIS — R34 Anuria and oliguria: Secondary | ICD-10-CM

## 2021-01-23 LAB — POCT URINALYSIS DIPSTICK OB
Bilirubin, UA: NEGATIVE
Blood, UA: NEGATIVE
Glucose, UA: NEGATIVE
Ketones, UA: NEGATIVE
Leukocytes, UA: NEGATIVE
Nitrite, UA: NEGATIVE
POC,PROTEIN,UA: NEGATIVE
Spec Grav, UA: 1.025 (ref 1.010–1.025)
Urobilinogen, UA: 2 E.U./dL — AB
pH, UA: 7 (ref 5.0–8.0)

## 2021-01-23 NOTE — Patient Instructions (Signed)
Vaginal Bleeding During Pregnancy, Third Trimester A small amount of bleeding, or spotting, from the vagina is common during early pregnancy. Sometimes the bleeding is normal and is not a problem, and sometimes it is a sign of something serious. In the third trimester, normal bleeding can happen:  Because of changes in your blood vessels.  When you have sex.  When you have pelvic exams. During this time, some abnormal things can cause bleeding. These include:  Infection in the womb.  Growths in the lowest part of the womb (cervix). These growths are also called polyps.  Problems of the placenta. The placenta may: ? Block the opening of the cervix. ? Break away from the womb. ? Grow into the muscle of the womb.  Early labor. Tell your doctor right away about any bleeding from your vagina. Follow these instructions at home: Watch your bleeding  Watch your condition for any changes. Let your doctor know if you are worried about something.  Try to know what causes your bleeding. Ask yourself these questions: ? Does the bleeding start on its own? ? Does the bleeding start after something is done, such as sex or a pelvic exam?  Use a diary to write the things you see about your bleeding. Write in your diary: ? If the bleeding flows freely without stopping, or if it starts and stops, and then starts again. ? If the bleeding is heavy or light. ? How many pads you use in a day and how much blood is in them.  Tell your doctor if you pass tissue. He or she may want to see it.   Activity  Follow your doctor's instructions about how active you can be. Your doctor may recommend that you: ? Stay in bed and only get up to use the bathroom. ? Continue light activity.  Ask your doctor if it is safe for you to drive.  Do not lift anything that is heavier than 10 lb (4.5 kg), or the limit that you are told.  Do not have sex until your doctor says that this is safe.  If needed, make plans  for someone to help you with your normal activities. Medicines  Take over-the-counter and prescription medicines only as told by your doctor.  Do not take aspirin. It can cause bleeding. General instructions  Do not use tampons.  Do not douche.  Keep all follow-up visits. Contact a doctor if:  You have vaginal bleeding at any time during pregnancy.  You have cramps.  You have a fever. Get help right away if:  You have very bad cramps.  You have very bad pain in your back or belly (abdomen).  You have a gush of fluid from your vagina.  Your bleeding gets worse.  You pass large clots or a lot of tissue from your vagina.  You feel light-headed or weak.  You pass out (faint).  Your baby is moving less than usual, or not moving at all. Summary  A small amount of bleeding is normal during pregnancy. Some bleeding may be caused by more serious problems.  Tell your doctor right away about any bleeding from your vagina.  Follow instructions from your doctor about how active you can be. You may need someone to help you with your normal activities. This information is not intended to replace advice given to you by your health care provider. Make sure you discuss any questions you have with your health care provider. Document Revised: 08/22/2020 Document Reviewed: 08/22/2020   Elsevier Patient Education  2021 Elsevier Inc[employer]  

## 2021-01-23 NOTE — Progress Notes (Signed)
   LOW-RISK PREGNANCY OFFICE VISIT Patient name: Mackenzie Mcknight MRN [id number]  Date of birth: 02/25/1991 Chief Complaint:   Routine Prenatal Visit  History of Present Illness:   Mackenzie Mcknight is a 30 y.o. G1P0 female at [redacted]w[redacted]d with an Estimated Date of Delivery: 04/13/21 being seen today for ongoing management of a low-risk pregnancy.  Today she reports feeling "weak" when she arrived, so she ate a [employer] bar. She reports spotting x 3 days, not heavy with wiping. She denies recent SI. She also complains of decreased urine output. She has increased her daily water intake, but not urinating more. Contractions: Not present. Vag. Bleeding: Scant.  Movement: Present. denies leaking of fluid. Review of Systems:   Pertinent items are noted in HPI Denies abnormal vaginal discharge w/ itching/odor/irritation, headaches, visual changes, shortness of breath, chest pain, abdominal pain, severe nausea/vomiting, or problems with urination or bowel movements unless otherwise stated above. Pertinent History Reviewed:  Reviewed past medical,surgical, social, obstetrical and family history.  Reviewed problem list, medications and allergies. Physical Assessment:   Vitals:   01/23/21 0854  BP: 134/74  Pulse: 80  Temp: 98.4 F (36.9 C)  Weight: 213 lb 3.2 oz (96.7 kg)  Body mass index is 25.95 kg/m.        Physical Examination:   General appearance: Well appearing, and in no distress  Mental status: Alert, oriented to person, place, and time  Skin: Warm & dry  Cardiovascular: Normal heart rate noted  Respiratory: Normal respiratory effort, no distress  Abdomen: Soft, gravid, nontender  Pelvic: Cervical exam performed  SE: cervix is smooth, pink, no lesions, scant amt of thin, white vaginal d/c -- WP, GC/CT done, no CMT, (+) friability when speculum rubbed across cervical tissue, no adnexal tenderness  Dilation: Closed      Extremities: Edema: Trace  Fetal Status: Fetal Heart Rate (bpm): 156  Fundal Height: 26 cm Movement: Present Presentation: Undeterminable  No results found for this or any previous visit (from the past 24 hour(s)).  Assessment & Plan:  1) Low-risk pregnancy G1P0 at [redacted]w[redacted]d with an Estimated Date of Delivery: 04/13/21   2) Supervision of normal first pregnancy, antepartum - Return next week for 2 hr GTT  3) Spotting affecting pregnancy in third trimester  - Cervicovaginal ancillary only( [place]) - Advised that after speculum exam, there is likely vaginal infection causing spotting   4) Decreased urine output  - Culture, OB Urine,  - POC Urinalysis Dipstick OB  5) Friable cervix - Likely infection as discussed previously  6) [redacted] weeks gestation of pregnancy    Meds: No orders of the defined types were placed in this encounter.  Labs/procedures today: Wet Prep  Plan:  Continue routine obstetrical care   Reviewed: Preterm labor symptoms and general obstetric precautions including but not limited to vaginal bleeding, contractions, leaking of fluid and fetal movement were reviewed in detail with the patient.  All questions were answered. Has home bp cuff. Check bp weekly, let us know if >140/90.   Follow-up: Return in about 2 weeks (around 02/06/2021) for Return OB visit.  Orders Placed This Encounter  Procedures  . Culture, OB Urine  . POC Urinalysis Dipstick OB   Raelyn Mora MSN, CNM 01/23/2021 9:50 AM

## 2021-01-24 ENCOUNTER — Telehealth: Payer: Self-pay | Admitting: *Deleted

## 2021-01-24 DIAGNOSIS — B9689 Other specified bacterial agents as the cause of diseases classified elsewhere: Secondary | ICD-10-CM

## 2021-01-24 LAB — CERVICOVAGINAL ANCILLARY ONLY
Bacterial Vaginitis (gardnerella): POSITIVE — AB
Candida Glabrata: NEGATIVE
Candida Vaginitis: NEGATIVE
Chlamydia: NEGATIVE
Comment: NEGATIVE
Comment: NEGATIVE
Comment: NEGATIVE
Comment: NEGATIVE
Comment: NEGATIVE
Comment: NORMAL
Neisseria Gonorrhea: NEGATIVE
Trichomonas: NEGATIVE

## 2021-01-24 MED ORDER — METRONIDAZOLE 500 MG PO TABS: 500.0000 mg | ORAL_TABLET | Freq: Two times a day (BID) | ORAL | 0 refills | Status: DC

## 2021-01-24 NOTE — Telephone Encounter (Signed)
-----   Message from Raelyn Mora, [place] sent at 01/24/2021 11:25 AM EST ----- Please treat for BV

## 2021-01-25 LAB — CULTURE, OB URINE

## 2021-01-25 LAB — URINE CULTURE, OB REFLEX

## 2021-01-27 ENCOUNTER — Other Ambulatory Visit: Payer: Medicaid Other

## 2021-02-06 ENCOUNTER — Encounter: Payer: Medicaid Other | Admitting: Obstetrics and Gynecology

## 2021-02-11 ENCOUNTER — Other Ambulatory Visit (INDEPENDENT_AMBULATORY_CARE_PROVIDER_SITE_OTHER): Payer: Medicaid Other | Admitting: *Deleted

## 2021-02-11 ENCOUNTER — Other Ambulatory Visit: Payer: Self-pay

## 2021-02-11 DIAGNOSIS — Z3400 Encounter for supervision of normal first pregnancy, unspecified trimester: Secondary | ICD-10-CM

## 2021-02-11 NOTE — Progress Notes (Signed)
    Patient in clinic for 28 week labs.   Martin, Tamika L, RN  

## 2021-02-12 ENCOUNTER — Telehealth: Payer: Self-pay | Admitting: *Deleted

## 2021-02-12 DIAGNOSIS — O99019 Anemia complicating pregnancy, unspecified trimester: Secondary | ICD-10-CM

## 2021-02-12 LAB — CBC
Hematocrit: 28.1 % — ABNORMAL LOW (ref 34.0–46.6)
Hemoglobin: 9.3 g/dL — ABNORMAL LOW (ref 11.1–15.9)
MCH: 28.4 pg (ref 26.6–33.0)
MCHC: 33.1 g/dL (ref 31.5–35.7)
MCV: 86 fL (ref 79–97)
Platelets: 303 10*3/uL (ref 150–450)
RBC: 3.27 x10E6/uL — ABNORMAL LOW (ref 3.77–5.28)
RDW: 14.8 % (ref 11.7–15.4)
WBC: 10.8 10*3/uL (ref 3.4–10.8)

## 2021-02-12 LAB — HIV ANTIBODY (ROUTINE TESTING W REFLEX): HIV Screen 4th Generation wRfx: NONREACTIVE

## 2021-02-12 LAB — GLUCOSE TOLERANCE, 2 HOURS W/ 1HR
Glucose, 1 hour: 114 mg/dL (ref 65–179)
Glucose, 2 hour: 85 mg/dL (ref 65–152)
Glucose, Fasting: 77 mg/dL (ref 65–91)

## 2021-02-12 LAB — RPR: RPR Ser Ql: NONREACTIVE

## 2021-02-12 MED ORDER — FERROUS SULFATE 325 (65 FE) MG PO TABS: ORAL_TABLET | ORAL | 3 refills | Status: DC

## 2021-02-12 MED ORDER — ASCORBIC ACID 500 MG PO TABS: ORAL_TABLET | ORAL | 3 refills | Status: DC

## 2021-02-12 NOTE — Telephone Encounter (Signed)
-----   Message from [place], [place] sent at 02/12/2021 11:16 AM EST ----- Please prescribe FeSO4 325 mg and Vitamin C 500 mg BID every other day for anemia in pregnancy.  Thanks, [employer]

## 2021-02-12 NOTE — Telephone Encounter (Signed)
Left voice message for patient that her iron level is low. Take iron tab and Vit C 1 tab PO BID every other day. All other labs normal.  Clovis Pu, RN

## 2021-02-13 ENCOUNTER — Encounter: Payer: Self-pay | Admitting: Obstetrics and Gynecology

## 2021-02-13 ENCOUNTER — Ambulatory Visit (INDEPENDENT_AMBULATORY_CARE_PROVIDER_SITE_OTHER): Payer: Medicaid Other | Admitting: Obstetrics and Gynecology

## 2021-02-13 ENCOUNTER — Other Ambulatory Visit: Payer: Self-pay

## 2021-02-13 VITALS — BP 110/71 | HR 102 | Temp 98.3°F | Wt 216.2 lb

## 2021-02-13 DIAGNOSIS — Z3400 Encounter for supervision of normal first pregnancy, unspecified trimester: Secondary | ICD-10-CM

## 2021-02-13 DIAGNOSIS — Z3A31 31 weeks gestation of pregnancy: Secondary | ICD-10-CM

## 2021-02-13 NOTE — Progress Notes (Signed)
   LOW-RISK PREGNANCY OFFICE VISIT Patient name: Mackenzie Mcknight MRN [id number]  Date of birth: 11-13-1991 Chief Complaint:   Routine Prenatal Visit  History of Present Illness:   Mackenzie Mcknight is a 30 y.o. G1P0 female at [redacted]w[redacted]d with an Estimated Date of Delivery: 04/13/21 being seen today for ongoing management of a low-risk pregnancy.  Today she reports some pelvic pressure. Contractions: Not present. Vag. Bleeding: None.  Movement: Present. denies leaking of fluid. Review of Systems:   Pertinent items are noted in HPI Denies abnormal vaginal discharge w/ itching/odor/irritation, headaches, visual changes, shortness of breath, chest pain, abdominal pain, severe nausea/vomiting, or problems with urination or bowel movements unless otherwise stated above. Pertinent History Reviewed:  Reviewed past medical,surgical, social, obstetrical and family history.  Reviewed problem list, medications and allergies. Physical Assessment:   Vitals:   02/13/21 1414  BP: 110/71  Pulse: (!) 102  Temp: 98.3 F (36.8 C)  Weight: 216 lb 3.2 oz (98.1 kg)  Body mass index is 26.32 kg/m.        Physical Examination:   General appearance: Well appearing, and in no distress  Mental status: Alert, oriented to person, place, and time  Skin: Warm & dry  Cardiovascular: Normal heart rate noted  Respiratory: Normal respiratory effort, no distress  Abdomen: Soft, gravid, nontender  Pelvic: Cervical exam deferred         Extremities: Edema: None  Fetal Status: Fetal Heart Rate (bpm): 141 Fundal Height: 28 cm Movement: Present    No results found for this or any previous visit (from the past 24 hour(s)).  Assessment & Plan:  1) Low-risk pregnancy G1P0 at [redacted]w[redacted]d with an Estimated Date of Delivery: 04/13/21   2) Supervision of normal first pregnancy, antepartum - Plan virtual visit at 34 wks, then GBS in office at 36 wks - Reassurance given that some pelvic pressure is normal as pregnancy gets larger  3) [redacted]  weeks gestation of pregnancy    Meds: No orders of the defined types were placed in this encounter.  Labs/procedures today: none  Plan:  Continue routine obstetrical care   Reviewed: Preterm labor symptoms and general obstetric precautions including but not limited to vaginal bleeding, contractions, leaking of fluid and fetal movement were reviewed in detail with the patient.  All questions were answered. Has home bp cuff. Check bp weekly, let us know if >140/90.   Follow-up: Return in about 3 weeks (around 03/06/2021) for Return OB - My Chart video.  No orders of the defined types were placed in this encounter.  Raelyn Mora MSN, CNM 02/13/2021 2:41 PM

## 2021-03-06 ENCOUNTER — Telehealth: Payer: Medicaid Other | Admitting: Certified Nurse Midwife

## 2021-04-04 ENCOUNTER — Encounter (HOSPITAL_COMMUNITY): Payer: Self-pay | Admitting: Obstetrics and Gynecology

## 2021-04-04 ENCOUNTER — Other Ambulatory Visit: Payer: Self-pay

## 2021-04-04 ENCOUNTER — Inpatient Hospital Stay (HOSPITAL_COMMUNITY)
Admission: AD | Admit: 2021-04-04 | Discharge: 2021-04-07 | DRG: 807 | Disposition: A | Discharge status: Home / Self Care | Payer: Medicaid Other | Attending: Obstetrics and Gynecology | Admitting: Obstetrics and Gynecology

## 2021-04-04 DIAGNOSIS — Z87891 Personal history of nicotine dependence: Secondary | ICD-10-CM | POA: Diagnosis not present

## 2021-04-04 DIAGNOSIS — O4292 Full-term premature rupture of membranes, unspecified as to length of time between rupture and onset of labor: Principal | ICD-10-CM | POA: Diagnosis present

## 2021-04-04 DIAGNOSIS — O2341 Unspecified infection of urinary tract in pregnancy, first trimester: Secondary | ICD-10-CM | POA: Diagnosis present

## 2021-04-04 DIAGNOSIS — O4212 Full-term premature rupture of membranes, onset of labor more than 24 hours following rupture: Secondary | ICD-10-CM | POA: Diagnosis not present

## 2021-04-04 DIAGNOSIS — O9903 Anemia complicating the puerperium: Secondary | ICD-10-CM | POA: Diagnosis not present

## 2021-04-04 DIAGNOSIS — O4290 Premature rupture of membranes, unspecified as to length of time between rupture and onset of labor, unspecified weeks of gestation: Secondary | ICD-10-CM | POA: Diagnosis present

## 2021-04-04 DIAGNOSIS — Z30011 Encounter for initial prescription of contraceptive pills: Secondary | ICD-10-CM | POA: Diagnosis not present

## 2021-04-04 DIAGNOSIS — Z20822 Contact with and (suspected) exposure to covid-19: Secondary | ICD-10-CM | POA: Diagnosis present

## 2021-04-04 DIAGNOSIS — Z3400 Encounter for supervision of normal first pregnancy, unspecified trimester: Secondary | ICD-10-CM

## 2021-04-04 DIAGNOSIS — G8918 Other acute postprocedural pain: Secondary | ICD-10-CM | POA: Diagnosis not present

## 2021-04-04 DIAGNOSIS — O9902 Anemia complicating childbirth: Secondary | ICD-10-CM | POA: Diagnosis present

## 2021-04-04 DIAGNOSIS — D649 Anemia, unspecified: Secondary | ICD-10-CM | POA: Diagnosis present

## 2021-04-04 DIAGNOSIS — Z3A38 38 weeks gestation of pregnancy: Secondary | ICD-10-CM

## 2021-04-04 DIAGNOSIS — O99355 Diseases of the nervous system complicating the puerperium: Secondary | ICD-10-CM | POA: Diagnosis not present

## 2021-04-04 LAB — TYPE AND SCREEN
ABO/RH(D): A POS
Antibody Screen: NEGATIVE

## 2021-04-04 LAB — COMPREHENSIVE METABOLIC PANEL
ALT: 15 U/L (ref 0–44)
AST: 21 U/L (ref 15–41)
Albumin: 2.9 g/dL — ABNORMAL LOW (ref 3.5–5.0)
Alkaline Phosphatase: 106 U/L (ref 38–126)
Anion gap: 8 (ref 5–15)
BUN: 12 mg/dL (ref 6–20)
CO2: 24 mmol/L (ref 22–32)
Calcium: 9.4 mg/dL (ref 8.9–10.3)
Chloride: 104 mmol/L (ref 98–111)
Creatinine, Ser: 0.72 mg/dL (ref 0.44–1.00)
GFR, Estimated: >60 mL/min (ref >60)
Glucose, Bld: 99 mg/dL (ref 70–99)
Potassium: 4.1 mmol/L (ref 3.5–5.1)
Sodium: 136 mmol/L (ref 135–145)
Total Bilirubin: 0.3 mg/dL (ref 0.3–1.2)
Total Protein: 7 g/dL (ref 6.5–8.1)

## 2021-04-04 LAB — CBC
HCT: 28.7 % — ABNORMAL LOW (ref 36.0–46.0)
Hemoglobin: 9.2 g/dL — ABNORMAL LOW (ref 12.0–15.0)
MCH: 26.6 pg (ref 26.0–34.0)
MCHC: 32.1 g/dL (ref 30.0–36.0)
MCV: 82.9 fL (ref 80.0–100.0)
Platelets: 327 10*3/uL (ref 150–400)
RBC: 3.46 MIL/uL — ABNORMAL LOW (ref 3.87–5.11)
RDW: 16.5 % — ABNORMAL HIGH (ref 11.5–15.5)
WBC: 11.6 10*3/uL — ABNORMAL HIGH (ref 4.0–10.5)
nRBC: 0 % (ref 0.0–0.2)

## 2021-04-04 LAB — PROTEIN / CREATININE RATIO, URINE
Creatinine, Urine: 182.68 mg/dL
Protein Creatinine Ratio: 0.07 mg/mg{Cre} (ref 0.00–0.15)
Total Protein, Urine: 13 mg/dL

## 2021-04-04 LAB — POCT FERN TEST: POCT Fern Test: POSITIVE

## 2021-04-04 MED ORDER — OXYTOCIN-SODIUM CHLORIDE 30-0.9 UT/500ML-% IV SOLN
1.0000 m[IU]/min | INTRAVENOUS | Status: DC
  Administered 2021-04-05: 2 m[IU]/min via INTRAVENOUS
  Filled 2021-04-04: qty 500

## 2021-04-04 MED ORDER — FENTANYL CITRATE (PF) 100 MCG/2ML IJ SOLN
INTRAMUSCULAR | Status: AC
  Administered 2021-04-05: 100 ug via INTRAVENOUS
  Filled 2021-04-04: qty 2

## 2021-04-04 MED ORDER — TERBUTALINE SULFATE 1 MG/ML IJ SOLN
0.2500 mg | Freq: Once | INTRAMUSCULAR | Status: AC | PRN
  Administered 2021-04-05: 0.25 mg via SUBCUTANEOUS
  Filled 2021-04-04: qty 1

## 2021-04-04 MED ORDER — ONDANSETRON HCL 4 MG/2ML IJ SOLN: 4.0000 mg | Freq: Four times a day (QID) | INTRAMUSCULAR | Status: DC | PRN

## 2021-04-04 MED ORDER — LACTATED RINGERS IV SOLN
500.0000 mL | INTRAVENOUS | Status: DC | PRN
  Administered 2021-04-05 (×2): 500 mL via INTRAVENOUS

## 2021-04-04 MED ORDER — OXYTOCIN BOLUS FROM INFUSION
333.0000 mL | Freq: Once | INTRAVENOUS | Status: DC
  Administered 2021-04-05: 333 mL via INTRAVENOUS

## 2021-04-04 MED ORDER — FENTANYL CITRATE (PF) 100 MCG/2ML IJ SOLN
50.0000 ug | INTRAMUSCULAR | Status: DC | PRN
  Administered 2021-04-04: 100 ug via INTRAVENOUS
  Filled 2021-04-04: qty 2

## 2021-04-04 MED ORDER — OXYCODONE-ACETAMINOPHEN 5-325 MG PO TABS: 1.0000 | ORAL_TABLET | ORAL | Status: DC | PRN

## 2021-04-04 MED ORDER — SOD CITRATE-CITRIC ACID 500-334 MG/5ML PO SOLN: 30.0000 mL | ORAL | Status: DC | PRN

## 2021-04-04 MED ORDER — ACETAMINOPHEN 325 MG PO TABS: 650.0000 mg | ORAL_TABLET | ORAL | Status: DC | PRN

## 2021-04-04 MED ORDER — OXYTOCIN-SODIUM CHLORIDE 30-0.9 UT/500ML-% IV SOLN
2.5000 [IU]/h | INTRAVENOUS | Status: DC
  Administered 2021-04-05: 2.5 [IU]/h via INTRAVENOUS

## 2021-04-04 MED ORDER — LIDOCAINE HCL (PF) 1 % IJ SOLN: 30.0000 mL | INTRAMUSCULAR | Status: DC | PRN

## 2021-04-04 MED ORDER — LACTATED RINGERS IV SOLN: INTRAVENOUS | Status: DC

## 2021-04-04 MED ORDER — OXYCODONE-ACETAMINOPHEN 5-325 MG PO TABS: 2.0000 | ORAL_TABLET | ORAL | Status: DC | PRN

## 2021-04-04 MED ORDER — MISOPROSTOL 50MCG HALF TABLET
50.0000 ug | ORAL_TABLET | ORAL | Status: DC | PRN
  Administered 2021-04-04: 50 ug via BUCCAL
  Filled 2021-04-04: qty 1

## 2021-04-04 NOTE — MAU Provider Note (Signed)
Event Date/Time   First Provider Initiated Contact with Patient 04/04/21 2107       S: [name] is a 30 y.o. G1P0 at [redacted]w[redacted]d  who presents to MAU today complaining of leaking of fluid since 0200. She denies vaginal bleeding. She denies contractions. She reports normal fetal movement.    O: BP 132/80   Pulse 91   Temp 98.8 F (37.1 C)   Resp 18   Ht 6\' 4"  (1.93 m)   Wt 105.2 kg   LMP 07/07/2020 (Exact Date)   SpO2 99%   BMI 28.24 kg/m  GENERAL: Well-developed, well-nourished female in no acute distress.  HEAD: Normocephalic, atraumatic.  CHEST: Normal effort of breathing, regular heart rate ABDOMEN: Soft, nontender, gravid PELVIC: Normal external female genitalia. Vagina is pink and rugated. Cervix with normal contour, no lesions. Normal discharge.  positive pooling.   Cervical exam:    Deferred, visually 1 cm on SSE  Fetal position confirmed vertex by bedside 07/09/2020 performed by [place], CNM  Limited OB Sharen Counter Date: 04/04/21 EDD : 04/13/21  based on LMP Viability:  FHT detected  Fetal position: Vertex noted with cranial features identified in pelvis on today's 06/13/21  Pt informed that the ultrasound is considered a limited OB ultrasound and is not intended to be a complete ultrasound exam.  Patient also informed that the ultrasound is not being completed with the intent of assessing for fetal or placental anomalies or any pelvic abnormalities.  Explained that the purpose of today's ultrasound is to assess for  presentation.  Patient acknowledges the purpose of the exam and the limitations of the study.     Fetal Monitoring: Baseline: 135 Variability: moderate Accelerations: present Decelerations: none Contractions: none on toco or to palpation  Results for orders placed or performed during the hospital encounter of 04/04/21 (from the past 24 hour(s))  POCT fern test     Status: Abnormal   Collection Time: 04/04/21  8:46 PM  Result Value Ref Range   POCT Fern  Test Positive = ruptured amniotic membanes      A: SIUP at [redacted]w[redacted]d  SROM  P: Admit to L&D  [redacted]w[redacted]d, CNM 04/04/2021 9:08 PM

## 2021-04-04 NOTE — H&P (Signed)
OBSTETRIC ADMISSION HISTORY AND PHYSICAL  Mackenzie Mcknight is a 30 y.o. female G1P0 with IUP at [redacted]w[redacted]d by LMP presenting for PROM sometime between 4/21 @2300  and 4/22 @0200 . She reports +FMs, no VB, no blurry vision, headaches or peripheral edema, and RUQ pain.  She plans on breast feeding. She request POPs for birth control. 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 Dysmenorrhea   . Headache     Past Surgical History: Past Surgical History:  Procedure Laterality Date  . NO PAST SURGERIES      Obstetrical History: OB History    Gravida  1   Para      Term      Preterm      AB      Living        SAB      IAB      Ectopic      Multiple      Live Births              Social History Social History   Socioeconomic History  . Marital status: Single    Spouse name: Not on file  . Number of children: Not on file  . Years of education: Not on file  . Highest education level: Bachelor's degree (e.g., BA, AB, BS)  Occupational History  . Not on file  Tobacco Use  . Smoking status: Former Smoker    Types: Cigarettes  . Smokeless tobacco: Never Used  Vaping Use  . Vaping Use: Never used  Substance and Sexual Activity  . Alcohol use: No  . Drug use: No  . Sexual activity: Yes    Birth control/protection: None  Other Topics Concern  . Not on file  Social History Narrative  . Not on file   Social Determinants of Health    Financial Resource Strain: Not on file  Food Insecurity: Not on file  Transportation Needs: Not on file  Physical Activity: Not on file  Stress: Not on file  Social Connections: Not on file    Family History: Family History  Problem Relation Age of Onset  . Diabetes Paternal Grandmother   . Alcohol abuse Neg Hx   . Arthritis Neg Hx   . Asthma Neg Hx   . Birth defects Neg Hx   . Cancer Neg Hx   . COPD Neg Hx   . Depression Neg Hx   . Drug abuse Neg Hx   . Early death Neg Hx   . Hearing loss Neg Hx   . Heart disease Neg Hx   . Hyperlipidemia Neg Hx   . Hypertension Neg Hx   . Kidney disease Neg Hx   . Learning disabilities Neg Hx   . Mental illness Neg Hx   . Mental retardation Neg Hx   . Miscarriages / Stillbirths Neg Hx   . Stroke Neg Hx   . Vision loss Neg Hx     Allergies: Allergies  Allergen Reactions  . Peanut-Containing Drug Products Anaphylaxis    Wheezing, Throat swelling    Medications Prior to Admission  Medication Sig Dispense Refill Last Dose  . albuterol (VENTOLIN HFA) 108 (90 Base) MCG/ACT inhaler Inhale 1-2 puffs into the lungs every 6 (six) hours as needed for wheezing or shortness of breath. 18 g 0 Past Week at Unknown time  . doxylamine, Sleep, (UNISOM) 25 MG tablet Take 25 mg by mouth at bedtime as needed.   Past Week at Unknown time  . Prenat w/o A-[id number]-[id number]-FA-DHA (CITRANATAL ASSURE) 35-1 & 300 MG tablet Take 1 tablet by mouth daily. 6 each 0 04/03/2021 at Unknown time  . acetaminophen (TYLENOL) 500 MG tablet Take 500 mg by mouth every 6 (six) hours as needed for headache.   Unknown at Unknown time  . ascorbic acid (VITAMIN C) 500 MG tablet Take one tablet by mouth twice a day every other day with Iron tablet. 30 tablet 3   . famotidine (PEPCID) 20 MG tablet Take 1 tablet (20 mg total) by mouth 2 (two) times daily. (Patient not taking: No sig reported) 60 tablet 2   . ferrous sulfate (FERROUSUL) 325 (65 FE) MG tablet Take one tablet by mouth  twice a day every other day with Vitamin C tablet. 30 tablet 3 Unknown at Unknown time  . metroNIDAZOLE (FLAGYL) 500 MG tablet Take 1 tablet (500 mg total) by mouth 2 (two) times daily. Take with food. 14 tablet 0   . promethazine (PHENERGAN) 25 MG tablet Take 1 tablet (25 mg total) by mouth every 6 (six) hours as needed for nausea or vomiting. (Patient not taking: Reported on 01/23/2021) 30 tablet 1      Review of Systems   All systems reviewed and negative except as stated in HPI  Blood pressure (!) 92/57, pulse 83, temperature 99.2 F (37.3 C), temperature source Oral, resp. rate 18, height 6\' 4"  (1.93 m), weight 105.2 kg, last menstrual period 07/07/2020, SpO2 99 %. General appearance: alert, cooperative and no distress Lungs: normal respiratory effort Heart: regular rate and rhythm Abdomen: soft, non-tender; gravid Pelvic: as noted below Extremities: Homans sign is negative, no sign of DVT Presentation: cephalic by BSUS in MAU Fetal monitoringBaseline: 130 bpm, Variability: Good {> 6 bpm), Accelerations: Reactive and Decelerations: Absent Uterine activity irritable Dilation: 1 Effacement (%): Thick Station: -2   Prenatal labs: ABO, Rh: --/--/A POS (04/22 2150) Antibody: NEG (04/22 2150) Rubella: 11.30 (11/17 1544) RPR: Non Reactive (03/01 0840)  HBsAg: Negative (11/17 1544)  HIV: Non Reactive (03/01 0840)  GBS:   unk, PCR collected in MAU 2 hr Glucola passed Genetic screening  Silent alpha thal carrier, otherwise normal Anatomy 03-10-1967 normal, no cardiac views obtained  Prenatal Transfer Tool  Maternal Diabetes: No Genetic Screening: Normal Maternal Ultrasounds/Referrals: Normal Fetal Ultrasounds or other Referrals:  None Maternal Substance Abuse:  No Significant Maternal Medications:  None Significant Maternal Lab Results: Other: GBS unk  Results for orders placed or performed during the hospital encounter of 04/04/21 (from the past 24 hour(s))  POCT fern test    Collection Time: 04/04/21  8:46 PM  Result Value Ref Range   POCT Fern Test Positive = ruptured amniotic membanes   CBC   Collection Time: 04/04/21  9:12 PM  Result Value Ref Range   WBC 11.6 (H) 4.0 - 10.5 K/uL   RBC 3.46 (L) 3.87 - 5.11 MIL/uL   Hemoglobin 9.2 (L) 12.0 - 15.0 g/dL   HCT 04/06/21 (L) 10.2 -  46.0 %   MCV 82.9 80.0 - 100.0 fL   MCH 26.6 26.0 - 34.0 pg   MCHC 32.1 30.0 - 36.0 g/dL   RDW 13.0 (H) 86.5 - 78.4 %   Platelets 327 150 - 400 K/uL   nRBC 0.0 0.0 - 0.2 %  Protein / creatinine ratio, urine   Collection Time: 04/04/21  9:12 PM  Result Value Ref Range   Creatinine, Urine 182.68 mg/dL   Total Protein, Urine 13 mg/dL   Protein Creatinine Ratio 0.07 0.00 - 0.15 mg/mg[Cre]  Type and screen [facility]   Collection Time: 04/04/21  9:50 PM  Result Value Ref Range   ABO/RH(D) A POS    Antibody Screen NEG    Sample Expiration      04/07/2021,2359 Performed at [facility], [address], [place], [place] [address]     Patient Active Problem List   Diagnosis Date Noted  . PROM (premature rupture of membranes) 04/04/2021  . Supervision of normal first pregnancy, antepartum 09/09/2020  . Urinary tract infection affecting care of mother in first trimester, antepartum 08/27/2020    Assessment/Plan:  Tanai Bouler is a 30 y.o. G1P0 at [redacted]w[redacted]d here for PROM 4/21 @2300 -4/22 @0200  (unsure of timing).  #PROM: Discussed IOL process with patient, based on cervical exam FB placed without difficulty and cytotec dosed. 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 IV vs PO iron postpartum pending EBL.  , MD  04/04/2021, 10:47 PM

## 2021-04-04 NOTE — MAU Note (Signed)
p stated she has had a "litle bit of leaking on and off since last night. Not enough to wear a pad but feeling some wetness leak out. Denies any vag bleeding today. Stated baby has not been moving as much as usual but still feeling baby move. Has not been to [facility] appointment since March.

## 2021-04-05 ENCOUNTER — Inpatient Hospital Stay (HOSPITAL_COMMUNITY): Payer: Medicaid Other | Admitting: Anesthesiology

## 2021-04-05 ENCOUNTER — Encounter (HOSPITAL_COMMUNITY): Payer: Self-pay | Admitting: Obstetrics and Gynecology

## 2021-04-05 DIAGNOSIS — O4212 Full-term premature rupture of membranes, onset of labor more than 24 hours following rupture: Secondary | ICD-10-CM

## 2021-04-05 DIAGNOSIS — Z3A38 38 weeks gestation of pregnancy: Secondary | ICD-10-CM

## 2021-04-05 LAB — RESP PANEL BY RT-PCR (FLU A&B, COVID) ARPGX2
Influenza A by PCR: NEGATIVE
Influenza B by PCR: NEGATIVE
SARS Coronavirus 2 by RT PCR: NEGATIVE

## 2021-04-05 LAB — GROUP B STREP BY PCR: Group B strep by PCR: NEGATIVE

## 2021-04-05 LAB — RPR: RPR Ser Ql: NONREACTIVE

## 2021-04-05 MED ORDER — DIPHENHYDRAMINE HCL 50 MG/ML IJ SOLN
12.5000 mg | INTRAMUSCULAR | Status: DC | PRN
Start: 2021-04-05 — End: 2021-04-05

## 2021-04-05 MED ORDER — TERBUTALINE SULFATE 1 MG/ML IJ SOLN
INTRAMUSCULAR | Status: AC
  Administered 2021-04-05: 0.25 mg via SUBCUTANEOUS
  Filled 2021-04-05: qty 1

## 2021-04-05 MED ORDER — DIPHENHYDRAMINE HCL 25 MG PO CAPS: 25.0000 mg | ORAL_CAPSULE | Freq: Four times a day (QID) | ORAL | Status: DC | PRN

## 2021-04-05 MED ORDER — PHENYLEPHRINE 40 MCG/ML (10ML) SYRINGE FOR IV PUSH (FOR BLOOD PRESSURE SUPPORT): 80.0000 ug | PREFILLED_SYRINGE | INTRAVENOUS | Status: DC | PRN

## 2021-04-05 MED ORDER — EPHEDRINE 5 MG/ML INJ: 10.0000 mg | INTRAVENOUS | Status: DC | PRN

## 2021-04-05 MED ORDER — FENTANYL-BUPIVACAINE-NACL 0.5-0.125-0.9 MG/250ML-% EP SOLN
12.0000 mL/h | EPIDURAL | Status: DC | PRN
  Administered 2021-04-05: 12 mL/h via EPIDURAL
  Filled 2021-04-05: qty 250

## 2021-04-05 MED ORDER — MAGNESIUM HYDROXIDE 400 MG/5ML PO SUSP: 30.0000 mL | ORAL | Status: DC | PRN

## 2021-04-05 MED ORDER — TETANUS-DIPHTH-ACELL PERTUSSIS 5-2.5-18.5 LF-MCG/0.5 IM SUSY: 0.5000 mL | PREFILLED_SYRINGE | Freq: Once | INTRAMUSCULAR | Status: DC

## 2021-04-05 MED ORDER — ONDANSETRON HCL 4 MG PO TABS: 4.0000 mg | ORAL_TABLET | ORAL | Status: DC | PRN

## 2021-04-05 MED ORDER — LIDOCAINE-EPINEPHRINE (PF) 2 %-1:200000 IJ SOLN
INTRAMUSCULAR | Status: DC | PRN
  Administered 2021-04-05: 4 mL via EPIDURAL

## 2021-04-05 MED ORDER — COCONUT OIL OIL: 1.0000 "application " | TOPICAL_OIL | Status: DC | PRN

## 2021-04-05 MED ORDER — FERROUS SULFATE 325 (65 FE) MG PO TABS
325.0000 mg | ORAL_TABLET | Freq: Every day | ORAL | Status: DC
  Administered 2021-04-06 – 2021-04-07 (×2): 325 mg via ORAL
  Filled 2021-04-05 (×2): qty 1

## 2021-04-05 MED ORDER — SIMETHICONE 80 MG PO CHEW: 80.0000 mg | CHEWABLE_TABLET | ORAL | Status: DC | PRN

## 2021-04-05 MED ORDER — LACTATED RINGERS IV SOLN
500.0000 mL | Freq: Once | INTRAVENOUS | Status: AC
Start: 2021-04-05 — End: 2021-04-05
  Administered 2021-04-05: 500 mL via INTRAVENOUS

## 2021-04-05 MED ORDER — WITCH HAZEL-GLYCERIN EX PADS
1.0000 "application " | MEDICATED_PAD | CUTANEOUS | Status: DC | PRN
  Administered 2021-04-07: 1 via TOPICAL

## 2021-04-05 MED ORDER — OXYCODONE-ACETAMINOPHEN 5-325 MG PO TABS: 1.0000 | ORAL_TABLET | ORAL | Status: DC | PRN

## 2021-04-05 MED ORDER — LACTATED RINGERS AMNIOINFUSION: INTRAVENOUS | Status: DC

## 2021-04-05 MED ORDER — MEASLES, MUMPS & RUBELLA VAC IJ SOLR: 0.5000 mL | Freq: Once | INTRAMUSCULAR | Status: DC

## 2021-04-05 MED ORDER — ACETAMINOPHEN 325 MG PO TABS
650.0000 mg | ORAL_TABLET | ORAL | Status: DC | PRN
  Administered 2021-04-05: 650 mg via ORAL
  Filled 2021-04-05: qty 2

## 2021-04-05 MED ORDER — PHENYLEPHRINE 40 MCG/ML (10ML) SYRINGE FOR IV PUSH (FOR BLOOD PRESSURE SUPPORT)
80.0000 ug | PREFILLED_SYRINGE | INTRAVENOUS | Status: DC | PRN
  Filled 2021-04-05: qty 10

## 2021-04-05 MED ORDER — OXYCODONE-ACETAMINOPHEN 5-325 MG PO TABS: 2.0000 | ORAL_TABLET | ORAL | Status: DC | PRN

## 2021-04-05 MED ORDER — IBUPROFEN 600 MG PO TABS
600.0000 mg | ORAL_TABLET | Freq: Four times a day (QID) | ORAL | Status: DC
  Administered 2021-04-05 – 2021-04-07 (×9): 600 mg via ORAL
  Filled 2021-04-05 (×9): qty 1

## 2021-04-05 MED ORDER — ONDANSETRON HCL 4 MG/2ML IJ SOLN: 4.0000 mg | INTRAMUSCULAR | Status: DC | PRN

## 2021-04-05 MED ORDER — BENZOCAINE-MENTHOL 20-0.5 % EX AERO
1.0000 "application " | INHALATION_SPRAY | CUTANEOUS | Status: DC | PRN
  Administered 2021-04-05: 1 via TOPICAL
  Filled 2021-04-05: qty 56

## 2021-04-05 MED ORDER — PRENATAL MULTIVITAMIN CH
1.0000 | ORAL_TABLET | Freq: Every day | ORAL | Status: DC
  Administered 2021-04-05 – 2021-04-07 (×3): 1 via ORAL
  Filled 2021-04-05 (×3): qty 1

## 2021-04-05 MED ORDER — DIBUCAINE (PERIANAL) 1 % EX OINT: 1.0000 "application " | TOPICAL_OINTMENT | CUTANEOUS | Status: DC | PRN

## 2021-04-05 NOTE — Progress Notes (Signed)
Labor Progress Note Mackenzie Mcknight is a 30 y.o. G1P0 at [redacted]w[redacted]d presented for PROM 4/21@2300 -4/22 @0200  (unsure timing). S: Doing well, feeling contractions in back.  O:  BP 122/75   Pulse 69   Temp 97.6 F (36.4 C) (Oral)   Resp 16   Ht 6\' 4"  (1.93 m)   Wt 105.2 kg   LMP 07/07/2020 (Exact Date)   SpO2 99%   BMI 28.24 kg/m  EFM: baseline 120bpm/mod variability/+ accels/intermittent variable decels with contractions Toco: q1-5 min, difficult to trace  CVE: Dilation: 6 Effacement (%): 90 Station: -1 Presentation: Vertex Exam by:: 07/09/2020, MD   A&P: 30 y.o. G1P0 [redacted]w[redacted]d presented for PROM 4/21@2300 -4/22 @0200  (unsure timing). #PROM: S/p FB. Has made good cervical change, feeling contractions in back. Given recurrent variable decels will start amnioinfusion. Patient desires epidural and will then start pitocin. #Pain: epidural to be placed #FWB: cat 2, overall reassuring with return to baseline #GBS negative #Anemia of pregnancy: admit hgb 9/2, not taking PO iron, asymptomatic. IV vs PO iron postpartum pending EBL.  05[birth year]1992, MD 3:13 AM

## 2021-04-05 NOTE — Anesthesia Postprocedure Evaluation (Signed)
Anesthesia Post Note  Patient: Mackenzie Mcknight  Procedure(s) Performed: AN AD HOC LABOR EPIDURAL     Patient location during evaluation: Mother Baby Anesthesia Type: Epidural Level of consciousness: awake Pain management: satisfactory to patient Vital Signs Assessment: post-procedure vital signs reviewed and stable Respiratory status: spontaneous breathing Cardiovascular status: stable Anesthetic complications: no   No complications documented.  Last Vitals:  Vitals:   04/05/21 1155 04/05/21 1531  BP: 123/69 (!) 103/54  Pulse: 70 63  Resp: 17 17  Temp: 36.9 C 37 C  SpO2: 100% 100%    Last Pain:  Vitals:   04/05/21 1531  TempSrc: Oral  PainSc: 0-No pain   Pain Goal:                Epidural/Spinal Function Cutaneous sensation: Normal sensation (04/05/21 1531), Patient able to flex knees: Yes (04/05/21 1531), Patient able to lift hips off bed: Yes (04/05/21 1531), Back pain beyond tenderness at insertion site: No (04/05/21 1531), Progressively worsening motor and/or sensory loss: No (04/05/21 1531), Bowel and/or bladder incontinence post epidural: No (04/05/21 1531)  Cephus Shelling

## 2021-04-05 NOTE — Lactation Note (Signed)
This note was copied from a baby's chart. Lactation Consultation Note  Patient Name: Mackenzie Mcknight Today's Date: 04/05/2021 Reason for consult: Initial assessment;Primapara;1st time breastfeeding;Early term 37-38.6wks;Other (Comment) (per mom baby recently fed, baby asleep and mom aware to call with feeding cues.) Age:28 hours 2nd LC visit, last feeding mom had latched the baby by herself - Latch score of 10.  Mom aware to page with feeding cues LC provided the [facility] brochure with resource numbers and BFSG.    Maternal Data Has patient been taught Hand Expression?: Yes (LC reviewed and recommended prior to latching - massage, hand expesss ,) Does the patient have breastfeeding experience prior to this delivery?: No  Feeding Mother's Current Feeding Choice: Breast Milk  LATCH Score - Latch Score by the [facility]  Latch: Grasps breast easily, tongue down, lips flanged, rhythmical sucking.  Audible Swallowing: Spontaneous and intermittent  Type of Nipple: Everted at rest and after stimulation  Comfort (Breast/Nipple): Soft / non-tender  Hold (Positioning): No assistance needed to correctly position infant at breast.  LATCH Score: 10   Lactation Tools Discussed/Used    Interventions Interventions: Breast feeding basics reviewed;Education  Discharge Pump: Personal;DEBP  Consult Status Consult Status: Follow-up Date: 04/05/21 Follow-up type: In-patient    Matilde Sprang Ellissa Ayo 04/05/2021, 2:31 PM

## 2021-04-05 NOTE — Lactation Note (Signed)
This note was copied from a baby's chart. Lactation Consultation Note  Patient Name: Mackenzie Mcknight [id number] Date: 04/05/2021 Reason for consult: Follow-up assessment;Mother's request;Primapara;1st time breastfeeding;Early term 37-38.6wks Age:28 hours  On arrival, Mom getting ready to do a feeding and asked for assistance with latching. Infant placed in football position on the left breast with breast compression to increase milk transfer, Cah sound noted during feeding.   LC reviewed with Mom feeding based on cues 8-12x in 24 hr period no more than 3hrs without an attempt.   LC assisted Mom switching to opposite side and Mom continued to nurse at the end of the visit.  All questions answered during feeding.   Maternal Data Has patient been taught Hand Expression?: Yes Does the patient have breastfeeding experience prior to this delivery?: No  Feeding Mother's Current Feeding Choice: Breast Milk  LATCH Score Latch: Grasps breast easily, tongue down, lips flanged, rhythmical sucking.  Audible Swallowing: Spontaneous and intermittent  Type of Nipple: Everted at rest and after stimulation  Comfort (Breast/Nipple): Soft / non-tender  Hold (Positioning): Assistance needed to correctly position infant at breast and maintain latch.  LATCH Score: 9   Lactation Tools Discussed/Used    Interventions Interventions: Breast feeding basics reviewed;Education;Support pillows;Position options;Assisted with latch;Skin to skin;Expressed milk;Breast massage;Hand express;Breast compression;Adjust position  Discharge    Consult Status Consult Status: Follow-up Date: 04/06/21 Follow-up type: In-patient    Mackenzie Cockrum  Mcknight 04/05/2021, 9:30 PM

## 2021-04-05 NOTE — Anesthesia Procedure Notes (Signed)
Epidural Patient location during procedure: OB Start time: 04/05/2021 3:40 AM End time: 04/05/2021 3:50 AM  Staffing Anesthesiologist: Elmer Picker, MD Performed: anesthesiologist   Preanesthetic Checklist Completed: patient identified, IV checked, risks and benefits discussed, monitors and equipment checked, pre-op evaluation and timeout performed  Epidural Patient position: sitting Prep: DuraPrep and site prepped and draped Patient monitoring: continuous pulse ox, blood pressure, heart rate and cardiac monitor Approach: midline Location: L3-L4 Injection technique: LOR air  Needle:  Needle type: Tuohy  Needle gauge: 17 G Needle length: 9 cm Needle insertion depth: 5 cm Catheter type: closed end flexible Catheter size: 19 Gauge Catheter at skin depth: 10 cm Test dose: negative  Assessment Sensory level: T8 Events: blood not aspirated, injection not painful, no injection resistance, no paresthesia and negative IV test  Additional Notes Patient identified. Risks/Benefits/Options discussed with patient including but not limited to bleeding, infection, nerve damage, paralysis, failed block, incomplete pain control, headache, blood pressure changes, nausea, vomiting, reactions to medication both or allergic, itching and postpartum back pain. Confirmed with bedside nurse the patient's most recent platelet count. Confirmed with patient that they are not currently taking any anticoagulation, have any bleeding history or any family history of bleeding disorders. Patient expressed understanding and wished to proceed. All questions were answered. Sterile technique was used throughout the entire procedure. Please see nursing notes for vital signs. Test dose was given through epidural catheter and negative prior to continuing to dose epidural or start infusion. Warning signs of high block given to the patient including shortness of breath, tingling/numbness in hands, complete motor block,  or any concerning symptoms with instructions to call for help. Patient was given instructions on fall risk and not to get out of bed. All questions and concerns addressed with instructions to call with any issues or inadequate analgesia.  Reason for block:procedure for pain

## 2021-04-05 NOTE — Lactation Note (Signed)
This note was copied from a baby's chart. Lactation Consultation Note  Patient Name: Mackenzie Mcknight Today's Date: 04/05/2021 Reason for consult: L&D Initial assessment;Early term 37-38.6wks;Primapara;1st time breastfeeding Age:LC visited mom at 50 hours PP in LD . Baby awake and rooting, LC offered to assist with latching and mom receptive.  Baby latched 1st time for 4 mins , 2nd time 10 mins , swallows both latches.  Mom asked LC if it was ok to pump and bottle feed.  LC 1st mentioned the benefits of latching the baby the breast , 2nd discussed how many times in 24 hours moms would have to pump ( 8-10 times both breast for 15 mins ) to establish and protect the milk supply besides feeding her baby.  Per mom has 2 DEBP 's at home.  Mom receptive to the baby feeding at the breast when [facility] visited. Latch score 9  Maternal Data Has patient been taught Hand Expression?: Yes  Feeding Mother's Current Feeding Choice: Breast Milk  LATCH Score Latch: Grasps breast easily, tongue down, lips flanged, rhythmical sucking.  Audible Swallowing: Spontaneous and intermittent  Type of Nipple: Everted at rest and after stimulation  Comfort (Breast/Nipple): Soft / non-tender  Hold (Positioning): Assistance needed to correctly position infant at breast and maintain latch.  LATCH Score: 9   Lactation Tools Discussed/Used    Interventions Interventions: Breast feeding basics reviewed;Assisted with latch;Skin to skin;Hand express;Breast compression;Education  Discharge    Consult Status Consult Status: Follow-up (F/U from LD) Date: 04/05/21 Follow-up type: In-patient    Mackenzie Mcknight 04/05/2021, 10:07 AM

## 2021-04-05 NOTE — Progress Notes (Signed)
Labor Progress Note Mackenzie Mcknight is a 30 y.o. G1P0 at [redacted]w[redacted]d presented for PROM 4/21@2300 -4/22 @0200  (unsure timing). S: Called to bedside by RN for prolonged decel. Patient doing well without complaints.  O:  BP 119/75 (BP Location: Right Arm)   Pulse 68   Temp 98.1 F (36.7 C) (Oral)   Resp 16   Ht 6\' 4"  (1.93 m)   Wt 105.2 kg   LMP 07/07/2020 (Exact Date)   SpO2 100%   BMI 28.24 kg/m  EFM: baseline 120bpm/mod variability/+ accels/intermittent variable decels with contractions, prolonged decels Toco: q1-5 min, difficult to trace  CVE: 9/90/0  A&P: 30 y.o. G1P0 [redacted]w[redacted]d presented for PROM 4/21@2300 -4/22 @0200  (unsure timing). #PROM: S/p FB. Has made good cervical change. Suspect recurrent decels in the setting of rapid cervical change. Amnioinfusion in progress. FSE re-applied. Terb given x2. Dr. [redacted]w[redacted]d notified. #Pain: epidural #FWB: cat 2 #GBS negative #Anemia of pregnancy: admit hgb 9.2, not taking PO iron, asymptomatic. IV vs PO iron postpartum pending EBL.  05-02-1992, MD 8:08 PM

## 2021-04-05 NOTE — Anesthesia Preprocedure Evaluation (Signed)
Anesthesia Evaluation  Patient identified by MRN, date of birth, ID band Patient awake    Reviewed: Allergy & Precautions, NPO status , Patient's Chart, lab work & pertinent test results  Airway Mallampati: II  TM Distance: >3 FB Neck ROM: Full    Dental no notable dental hx.    Pulmonary former smoker,    Pulmonary exam normal breath sounds clear to auscultation       Cardiovascular negative cardio ROS Normal cardiovascular exam Rhythm:Regular Rate:Normal     Neuro/Psych  Headaches, negative psych ROS   GI/Hepatic negative GI ROS, Neg liver ROS,   Endo/Other  negative endocrine ROS  Renal/GU negative Renal ROS  negative genitourinary   Musculoskeletal negative musculoskeletal ROS (+)   Abdominal   Peds  Hematology  (+) Blood dyscrasia (Hg  9.2), anemia ,   Anesthesia Other Findings   Reproductive/Obstetrics (+) Pregnancy                             Anesthesia Physical Anesthesia Plan  ASA: II  Anesthesia Plan: Epidural   Post-op Pain Management:    Induction:   PONV Risk Score and Plan: Treatment may vary due to age or medical condition  Airway Management Planned: Natural Airway  Additional Equipment:   Intra-op Plan:   Post-operative Plan:   Informed Consent: I have reviewed the patients History and Physical, chart, labs and discussed the procedure including the risks, benefits and alternatives for the proposed anesthesia with the patient or authorized representative who has indicated his/her understanding and acceptance.       Plan Discussed with: Anesthesiologist  Anesthesia Plan Comments: (Patient identified. Risks, benefits, options discussed with patient including but not limited to bleeding, infection, nerve damage, paralysis, failed block, incomplete pain control, headache, blood pressure changes, nausea, vomiting, reactions to medication, itching, and post  partum back pain. Confirmed with bedside nurse the patient's most recent platelet count. Confirmed with the patient that they are not taking any anticoagulation, have any bleeding history or any family history of bleeding disorders. Patient expressed understanding and wishes to proceed. All questions were answered. )        Anesthesia Quick Evaluation

## 2021-04-05 NOTE — Discharge Summary (Addendum)
Postpartum Discharge Summary  Date of Service updated 04/06/21     Patient Name: Mackenzie Mcknight DOB: [birth year] MRN: [id number]  Date of admission: 04/04/2021 Delivery date:04/05/2021  Delivering provider: Manya Silvas  Date of discharge: 04/06/2021  Admitting diagnosis: PROM (premature rupture of membranes) [O42.90] Intrauterine pregnancy: [redacted]w[redacted]d    Secondary diagnosis:  Active Problems:   Urinary tract infection affecting care of mother in first trimester, antepartum   Supervision of normal first pregnancy, antepartum   PROM (premature rupture of membranes)   Vaginal delivery   Shoulder dystocia during labor and delivery  Additional problems: none    Discharge diagnosis: Term Pregnancy Delivered and Anemia                                              Post partum procedures:IV venofer infusion Augmentation: Pitocin, Cytotec and IP Foley Complications: None Shoulder Dystocia  Hospital course: Induction of Labor With Vaginal Delivery   30y.o. yo G1P0 at 310w6das admitted to the hospital 04/04/2021 for induction of labor.  Indication for induction: PROM.  Patient labor course as follows: Recurrent variables and few prolonged decels in labor. 2 minute shoulder dystocia resolved w/ McRoberts, Suprapubic and delivery pf posterior (right) arm.  Membrane Rupture Time/Date: 2:00 AM ,04/04/2021   Delivery Method:Vaginal, Spontaneous  Episiotomy:  None Lacerations:  Labial;1st degree Superficial, labial. No repair indicated.  Details of delivery can be found in separate delivery note.   Patient had a routine postpartum course. Patient is discharged home 04/06/21.  Newborn Data: Birth date:04/05/2021  Birth time:8:38 AM  Gender:Female  Living status:Living  Apgars:8 ,9  Weight:2955 g   Magnesium Sulfate received: No BMZ received: No Rhophylac:N/A MMR:N/A T-DaP:Given postpartum Flu: No Transfusion:No  Physical exam  Vitals:   04/05/21 1531 04/05/21 1940 04/05/21 2325  04/06/21 0546  BP: (!) 103/54 119/75 113/68 111/67  Pulse: 63 68 77 84  Resp: _0 Temp: 98.6 F (37 C) 98.1 F (36.7 C) 98.5 F (36.9 C) 98.6 F (37 C)  TempSrc: Oral Oral Oral Oral  SpO2: 100% 100% 100% 100%  Weight:      Height:       General: alert, cooperative and no distress Lochia: appropriate Uterine Fundus: firm Incision: N/A DVT Evaluation: No evidence of DVT seen on physical exam. Labs: Lab Results  Component Value Date   WBC 13.4 (H) 04/06/2021   HGB 7.7 (L) 04/06/2021   HCT 23.7 (L) 04/06/2021   MCV 82.3 04/06/2021   PLT 284 04/06/2021   CMP Latest Ref Rng & Units 04/04/2021  Glucose 70 - 99 mg/dL 99  BUN 6 - 20 mg/dL 12  Creatinine 0.44 - 1.00 mg/dL 0.72  Sodium 135 - 145 mmol/L 136  Potassium 3.5 - 5.1 mmol/L 4.1  Chloride 98 - 111 mmol/L 104  CO2 22 - 32 mmol/L 24  Calcium 8.9 - 10.3 mg/dL 9.4  Total Protein 6.5 - 8.1 g/dL 7.0  Total Bilirubin 0.3 - 1.2 mg/dL 0.3  Alkaline Phos 38 - 126 U/L 106  AST 15 - 41 U/L 21  ALT 0 - 44 U/L 15   Edinburgh Score: No flowsheet data found.   After visit meds:  Allergies as of 04/06/2021      Reactions   Peanut-containing Drug Products Anaphylaxis   Wheezing, Throat swelling  Medication List    STOP taking these medications   metroNIDAZOLE 500 MG tablet Commonly known as: FLAGYL   promethazine 25 MG tablet Commonly known as: PHENERGAN     TAKE these medications   acetaminophen 500 MG tablet Commonly known as: TYLENOL Take 2 tablets (1,000 mg total) by mouth every 6 (six) hours as needed for headache. What changed: how much to take   albuterol 108 (90 Base) MCG/ACT inhaler Commonly known as: VENTOLIN HFA Inhale 1-2 puffs into the lungs every 6 (six) hours as needed for wheezing or shortness of breath.   ascorbic acid 500 MG tablet Commonly known as: VITAMIN C Take one tablet by mouth twice a day every other day with Iron tablet.   CitraNatal Assure 35-1 & 300 MG tablet Take 1  tablet by mouth daily.   coconut oil Oil Apply 1 application topically as needed.   doxylamine (Sleep) 25 MG tablet Commonly known as: UNISOM Take 25 mg by mouth at bedtime as needed.   famotidine 20 MG tablet Commonly known as: Pepcid Take 1 tablet (20 mg total) by mouth 2 (two) times daily.   ferrous sulfate 325 (65 FE) MG tablet Commonly known as: FerrouSul Take one tablet by mouth twice a day every other day with Vitamin C tablet.   ibuprofen 600 MG tablet Commonly known as: ADVIL Take 1 tablet (600 mg total) by mouth every 6 (six) hours as needed.   norethindrone 0.35 MG tablet Commonly known as: Ortho Micronor Take 1 tablet (0.35 mg total) by mouth daily.        Discharge home in stable condition Infant Feeding: Breast Infant Disposition:home with mother Discharge instruction: per After Visit Summary and Postpartum booklet. Activity: Advance as tolerated. Pelvic rest for 6 weeks.  Diet: routine diet Future Appointments:No future appointments. Follow up Visit:   Please schedule this patient for a Virtual postpartum visit in 4 weeks with the following provider: Any provider. Additional Postpartum F/U:None  Low risk pregnancy complicated by: Shoulder Dystocia Delivery mode:  Vaginal, Spontaneous  Anticipated Birth Control:  POPs, rx sent to pharmacy at discharge   04/06/2021 Alicia C Firestone, MD   

## 2021-04-06 DIAGNOSIS — Z30011 Encounter for initial prescription of contraceptive pills: Secondary | ICD-10-CM

## 2021-04-06 DIAGNOSIS — G8918 Other acute postprocedural pain: Secondary | ICD-10-CM

## 2021-04-06 DIAGNOSIS — O99355 Diseases of the nervous system complicating the puerperium: Secondary | ICD-10-CM

## 2021-04-06 LAB — CBC
HCT: 23.7 % — ABNORMAL LOW (ref 36.0–46.0)
Hemoglobin: 7.7 g/dL — ABNORMAL LOW (ref 12.0–15.0)
MCH: 26.7 pg (ref 26.0–34.0)
MCHC: 32.5 g/dL (ref 30.0–36.0)
MCV: 82.3 fL (ref 80.0–100.0)
Platelets: 284 10*3/uL (ref 150–400)
RBC: 2.88 MIL/uL — ABNORMAL LOW (ref 3.87–5.11)
RDW: 16.8 % — ABNORMAL HIGH (ref 11.5–15.5)
WBC: 13.4 10*3/uL — ABNORMAL HIGH (ref 4.0–10.5)
nRBC: 0 % (ref 0.0–0.2)

## 2021-04-06 MED ORDER — NORETHINDRONE 0.35 MG PO TABS: 1.0000 | ORAL_TABLET | Freq: Every day | ORAL | 11 refills | Status: DC

## 2021-04-06 MED ORDER — COCONUT OIL OIL: 1.0000 "application " | TOPICAL_OIL | 0 refills | Status: AC | PRN

## 2021-04-06 MED ORDER — ACETAMINOPHEN 500 MG PO TABS: 1000.0000 mg | ORAL_TABLET | Freq: Four times a day (QID) | ORAL | 0 refills | Status: AC | PRN

## 2021-04-06 MED ORDER — SODIUM CHLORIDE 0.9 % IV SOLN
500.0000 mg | Freq: Once | INTRAVENOUS | Status: AC
  Administered 2021-04-06: 500 mg via INTRAVENOUS
  Filled 2021-04-06: qty 25

## 2021-04-06 MED ORDER — IBUPROFEN 600 MG PO TABS: 600.0000 mg | ORAL_TABLET | Freq: Four times a day (QID) | ORAL | 0 refills | Status: DC | PRN

## 2021-04-06 NOTE — Lactation Note (Signed)
This note was copied from a baby's chart. Lactation Consultation Note  Patient Name: Mackenzie Mcknight [id number] Date: 04/06/2021 Reason for consult: Follow-up assessment;Primapara;1st time breastfeeding;Early term 37-38.6wks;Infant weight loss;Other (Comment) (5 % weight loss) Age:28 hours - mom receiving IV Iron  Baby awake and rooting, LC offered to assist with latch / reviewed hand expressing with several drops noted / using the football position / baby started out sucking on its tongue before latching and worked with mom to get the baby to open wide and then latched for 10 mins with swallows / released and nipple well rounded. Latch score 9  Per mom the hand pump is hurting - LC reviewed how to use it and increased the flange to #27.  LC recommended prior to latch - breast massage, hand express, pre - pump if needed to stretch the nipple / areola complex and then reverse pressure due to areola edema.  Per mom would like early D/C / RN aware.  LC will F/U.    Maternal Data Has patient been taught Hand Expression?: Yes  Feeding Mother's Current Feeding Choice: Breast Milk  LATCH Score Latch: Grasps breast easily, tongue down, lips flanged, rhythmical sucking.  Audible Swallowing: Spontaneous and intermittent  Type of Nipple: Everted at rest and after stimulation  Comfort (Breast/Nipple): Soft / non-tender  Hold (Positioning): Assistance needed to correctly position infant at breast and maintain latch.  LATCH Score: 9   Lactation Tools Discussed/Used Tools: Pump;Flanges Flange Size: 24;27 Breast pump type: Manual Pump Education: Milk Storage  Interventions Interventions: Breast feeding basics reviewed;Assisted with latch;Skin to skin;Breast massage;Hand express;Breast compression;Adjust position;Support pillows;Position options;Hand pump;Education  Discharge Pump: Personal;Manual WIC Program: No  Consult Status Consult Status: Follow-up Date: 04/06/21 Follow-up  type: In-patient    Matilde Sprang Shaquoia Miers 04/06/2021, 10:40 AM

## 2021-04-06 NOTE — Progress Notes (Signed)
Baby not D/C'd. Will cancel pt D/C.

## 2021-04-07 DIAGNOSIS — D649 Anemia, unspecified: Secondary | ICD-10-CM

## 2021-04-07 DIAGNOSIS — O9903 Anemia complicating the puerperium: Secondary | ICD-10-CM

## 2021-04-07 LAB — GC/CHLAMYDIA PROBE AMP (~~LOC~~) NOT AT ARMC
Chlamydia: NEGATIVE
Comment: NEGATIVE
Comment: NORMAL
Neisseria Gonorrhea: NEGATIVE

## 2021-04-07 MED ORDER — IRON 325 (65 FE) MG PO TABS: 1.0000 | ORAL_TABLET | ORAL | 0 refills | Status: DC

## 2021-04-07 NOTE — Lactation Note (Addendum)
This note was copied from a baby's chart. Lactation Consultation Note  Patient Name: Girl Genavie Boettger Today's Date: 04/07/2021 Reason for consult: Term;Follow-up assessment;1st time breastfeeding;Primapara Age:28 hours  LC student to room for discharge consultation. Baby was swaddled and asleep in mother's arms. Mom reported that breastfeeding is going well, however she had difficulty latching over night, so formula supplementation was needed. Baby was not showing hunger cues at this time.  Mother's breast were very full; mature milk started coming in. There was swelling and fluid around the nipple as well as leaking. Breast pads, shells, and ice was provided for leaking and relief of soreness.   Mom reported that she has a DEBP at home, and that the manual pump caused pain. LC student provided coconut oil to lather flanges with. [facility] student went over pumping education; storage, frequency, cleaning, and how long to pump.  The last time baby ate was around 6:30 where she was given 27 mL of formula. Since it had been some time since last feeding, LC student taught mom hand expression and spoon feeding to wake baby up. Baby started to cue so [facility] student attempted latching baby to the right breast in X-cradle. Baby was sucking on lip and started to get frustrated at the breast. [facility] student realized that baby had a void so she changed the diaper. Baby calmed down and was able to latch to the left breast for ~2 minutes before coming off the breast. Multiple attempts and positions were needed to sustain latch. While in the room, mom's breast were becoming more full causing baby to have a harder time sustaining latch. LC performed reverse pressure and also provided a nipple shield to be used on the left breast. Baby was able to latch with audible, strong, sucks and swallows and stayed latched as LC student and LC left the room.   Mother is aware of lactation services and will call and/or schedule an  outpatient appointment if needed. Mom plans to pump, nurse, and formula feed at home. Discharge information was reviewed; engorgement, pumping, infant feeding cues, how often to feed, Input and output, and weight check at 48 hours of life. All questions and concerns were answered at this time.  Feeding plan: 1. Breast feed on demand 8-12 times in a 24 hour period (x-cradle or football hold)    - feed on one breast and when baby comes off, try to offer the second breast   - if baby is having a difficult time latching due to swelling, use a nipple shield   - use shells or breastpads to help with leaking (shells specifically to draw out nipple if there is swelling) 2. Pump 4-6 times in a 24 hour period after feedings with DEBP 3. Hand express before feedings 4. Ice between feedings to alleviate pain 5. You can use warm moist heat before feedings to soften breast tissue 6. Formula feed after nursing if necessary 7. Rest, hydrate, eat balanced meals   Mom reported that she experiences soreness when using the manual pump  Maternal Data Has patient been taught Hand Expression?: Yes Does the patient have breastfeeding experience prior to this delivery?: No  Feeding Mother's Current Feeding Choice: Breast Milk and Formula Nipple Type: Extra Slow Flow  LATCH Score Latch: Repeated attempts needed to sustain latch, nipple held in mouth throughout feeding, stimulation needed to elicit sucking reflex.  Audible Swallowing: Spontaneous and intermittent  Type of Nipple: Everted at rest and after stimulation  Comfort (Breast/Nipple): Filling, red/small  blisters or bruises, mild/mod discomfort  Hold (Positioning): Assistance needed to correctly position infant at breast and maintain latch.  LATCH Score: 7   Lactation Tools Discussed/Used Tools: Shells;Coconut oil;Nipple Ventura Sellers Size: 27 Breast pump type: Manual Pump Education: Setup, frequency, and cleaning;Milk Storage;Other  (comment) Reason for Pumping: mothers choice, stimulation of breast  Interventions Interventions: Breast feeding basics reviewed;Assisted with latch;Skin to skin;Breast massage;Hand express;Reverse pressure;Adjust position;Support pillows;Position options;Expressed milk;Coconut oil;Shells;Hand pump;Ice;Education  Discharge Discharge Education: Engorgement and breast care;Warning signs for feeding baby;Outpatient recommendation Pump: Manual  Consult Status Consult Status: Complete Follow-up type: Call as needed    Cay Schillings 04/07/2021, 10:28 AM

## 2021-04-07 NOTE — Discharge Summary (Signed)
Postpartum Discharge Summary  Date of Service updated 04/07/21    Patient Name: Mackenzie Mcknight DOB: Aug 25, 1991 MRN: [id number]  Date of admission: 04/04/2021 Delivery date:04/05/2021  Delivering provider: Manya Silvas  Date of discharge: 04/07/2021  Admitting diagnosis: PROM (premature rupture of membranes) [O42.90] Intrauterine pregnancy: [redacted]w[redacted]d    Secondary diagnosis:  Active Problems:   Urinary tract infection affecting care of mother in first trimester, antepartum   Supervision of normal first pregnancy, antepartum   PROM (premature rupture of membranes)   Vaginal delivery   Shoulder dystocia during labor and delivery  Additional problems: None    Discharge diagnosis: Term Pregnancy Delivered and Anemia                                              Post partum procedures:Iv Venofer infusion Augmentation: Pitocin, Cytotec and IP Foley Complications: shoulder dystocia   Hospital course:   30y.o. yo G1P0 at 338w6das admitted to the hospital 04/04/2021 for induction of labor.  Indication for induction: PROM.  Patient labor course as follows: Recurrent variables and few prolonged decels in labor. 2 minute shoulder dystocia resolved w/ McRoberts, Suprapubic and delivery pf posterior (right) arm.  Membrane Rupture Time/Date: 2:00 AM ,04/04/2021   Delivery Method:Vaginal, Spontaneous  Episiotomy:  None Lacerations:  Labial;1st degree Superficial, labial. No repair indicated.  Details of delivery can be found in separate delivery note.   Patient had a routine postpartum course. Patient is discharged home 04/06/21.  Newborn Data: Birth date:04/05/2021  Birth time:8:38 AM  Gender:Female  Living status:Living  Apgars:8 ,9  Weight:2955 g   Magnesium Sulfate received: No BMZ received: No Rhophylac:N/A MMR:N/A T-DaP:Given postpartum Flu: No Transfusion:No  Physical exam  Vitals:   04/06/21 0546 04/06/21 1635 04/06/21 2124 04/07/21 0545  BP: 111/67 122/81 120/71 115/65   Pulse: 84 74 77 71  Resp: _0 Temp: 98.6 F (37 C) 98.1 F (36.7 C) 98.5 F (36.9 C) 98.1 F (36.7 C)  TempSrc: Oral Oral Oral Oral  SpO2: 100% 100% 100% 99%  Weight:      Height:       General: alert, cooperative and no distress Lochia: appropriate Uterine Fundus: firm Incision: N/A DVT Evaluation: No evidence of DVT seen on physical exam. Labs: Lab Results  Component Value Date   WBC 13.4 (H) 04/06/2021   HGB 7.7 (L) 04/06/2021   HCT 23.7 (L) 04/06/2021   MCV 82.3 04/06/2021   PLT 284 04/06/2021   CMP Latest Ref Rng & Units 04/04/2021  Glucose 70 - 99 mg/dL 99  BUN 6 - 20 mg/dL 12  Creatinine 0.44 - 1.00 mg/dL 0.72  Sodium 135 - 145 mmol/L 136  Potassium 3.5 - 5.1 mmol/L 4.1  Chloride 98 - 111 mmol/L 104  CO2 22 - 32 mmol/L 24  Calcium 8.9 - 10.3 mg/dL 9.4  Total Protein 6.5 - 8.1 g/dL 7.0  Total Bilirubin 0.3 - 1.2 mg/dL 0.3  Alkaline Phos 38 - 126 U/L 106  AST 15 - 41 U/L 21  ALT 0 - 44 U/L 15   Edinburgh Score: Edinburgh Postnatal Depression Scale Screening Tool 04/06/2021  I have been able to laugh and see the funny side of things. 0  I have looked forward with enjoyment to things. 0  I have blamed myself unnecessarily when things went wrong. 0  I have been anxious or worried for no good reason. 0  I have felt scared or panicky for no good reason. 0  Things have been getting on top of me. 0  I have been so unhappy that I have had difficulty sleeping. 0  I have felt sad or miserable. 0  I have been so unhappy that I have been crying. 0  The thought of harming myself has occurred to me. 0  Edinburgh Postnatal Depression Scale Total 0     After visit meds:  Allergies as of 04/07/2021      Reactions   Peanut-containing Drug Products Anaphylaxis   Wheezing, Throat swelling      Medication List    STOP taking these medications   metroNIDAZOLE 500 MG tablet Commonly known as: FLAGYL   promethazine 25 MG tablet Commonly known as:  PHENERGAN     TAKE these medications   acetaminophen 500 MG tablet Commonly known as: TYLENOL Take 2 tablets (1,000 mg total) by mouth every 6 (six) hours as needed for headache. What changed: how much to take   albuterol 108 (90 Base) MCG/ACT inhaler Commonly known as: VENTOLIN HFA Inhale 1-2 puffs into the lungs every 6 (six) hours as needed for wheezing or shortness of breath.   ascorbic acid 500 MG tablet Commonly known as: VITAMIN C Take one tablet by mouth twice a day every other day with Iron tablet.   CitraNatal Assure 35-1 & 300 MG tablet Take 1 tablet by mouth daily.   coconut oil Oil Apply 1 application topically as needed.   doxylamine (Sleep) 25 MG tablet Commonly known as: UNISOM Take 25 mg by mouth at bedtime as needed.   famotidine 20 MG tablet Commonly known as: Pepcid Take 1 tablet (20 mg total) by mouth 2 (two) times daily.   ferrous sulfate 325 (65 FE) MG tablet Commonly known as: FerrouSul Take one tablet by mouth twice a day every other day with Vitamin C tablet. What changed: Another medication with the same name was added. Make sure you understand how and when to take each.   Iron 325 (65 Fe) MG Tabs Take 1 tablet (325 mg total) by mouth every other day. What changed: You were already taking a medication with the same name, and this prescription was added. Make sure you understand how and when to take each.   ibuprofen 600 MG tablet Commonly known as: ADVIL Take 1 tablet (600 mg total) by mouth every 6 (six) hours as needed.   norethindrone 0.35 MG tablet Commonly known as: Ortho Micronor Take 1 tablet (0.35 mg total) by mouth daily.        Discharge home in stable condition Infant Feeding: Breast Infant Disposition:home with mother Discharge instruction: per After Visit Summary and Postpartum booklet. Activity: Advance as tolerated. Pelvic rest for 6 weeks.  Diet: routine diet Future Appointments: Future Appointments  Date Time  Provider Department Center  05/07/2021 10:50 AM Dawson, Rolitta, CNM [facility] None   Follow up Visit:  Please schedule this patient for a Virtual postpartum visit in 4 weeks with the following provider: Any provider. Additional Postpartum F/U:None  Low risk pregnancy complicated by: Shoulder Dystocia Delivery mode: Vaginal, Spontaneous  Anticipated Birth Control: POPs, rx sent to pharmacy at discharge # iron every other day, RX sent.    04/07/2021 Jennifer Rasch, NP   

## 2021-05-07 ENCOUNTER — Telehealth: Payer: Medicaid Other | Admitting: Obstetrics and Gynecology

## 2021-05-08 ENCOUNTER — Telehealth (INDEPENDENT_AMBULATORY_CARE_PROVIDER_SITE_OTHER): Payer: Medicaid Other | Admitting: Obstetrics and Gynecology

## 2021-05-08 ENCOUNTER — Encounter: Payer: Self-pay | Admitting: Obstetrics and Gynecology

## 2021-05-08 DIAGNOSIS — Z3041 Encounter for surveillance of contraceptive pills: Secondary | ICD-10-CM

## 2021-05-08 NOTE — Progress Notes (Deleted)
Provider location: [facility]   Patient location: Home  I connected with@ on 05/08/21 at  2:10 PM EDT by Mychart Video Encounter and verified that I am speaking with the correct person using two identifiers.       I discussed the limitations, risks, security and privacy concerns of performing an evaluation and management service virtually and the availability of in person appointments. I also discussed with the patient that there may be a patient responsible charge related to this service. The patient expressed understanding and agreed to proceed.  Post Partum Visit Note Subjective:   Mackenzie Mcknight is a 30 y.o. G34P1001 female who presents for a postpartum visit. She is 4 weeks postpartum following a normal spontaneous vaginal delivery.  I have fully reviewed the prenatal and intrapartum course. The delivery was at 38.6 gestational weeks.  Anesthesia: epidural. Postpartum course has been ***. Baby is doing well***. Baby is feeding by bottle - Enfamil Gentle. Bleeding no bleeding. Bowel function is normal. Bladder function is normal. Patient is not sexually active. Contraception method is POPs. Patient has not started taking the pills at this time. Patient is thinking about switching methods.. Postpartum depression screening: negative.   The pregnancy intention screening data noted above was reviewed. Potential methods of contraception were discussed. The patient elected to proceed with {Upstream End Methods:24109}.     {Common ambulatory SmartLinks:19316}  Review of Systems {ros; complete:30496}  Objective:  LMP 07/07/2020 (Exact Date)   Breastfeeding No     General:  Alert, oriented and cooperative. Patient is in no acute distress.  Respiratory: Normal respiratory effort, no problems with respiration noted  Mental Status: Normal mood and affect. Normal behavior. Normal judgment and thought content.  Rest of physical exam deferred due to type of  encounter   Assessment:    *** postpartum exam.  Plan:  Essential components of care per ACOG recommendations:  1.  Mood and well being: Patient with {gen negative/positive:315881} depression screening today. Reviewed local resources for support.  - Patient {Action; does/does not:19097} use tobacco. ***If using tobacco we discussed reduction and for recently cessation risk of relapse - hx of drug use? {yes/no:20286}  *** If yes, discussed support systems  2. Infant care and feeding:  -Patient currently breastmilk feeding? {yes/no:20286} ***If breastmilk feeding discussed return to work and pumping. If needed, patient was provided letter for work to allow for every 2-3 hr pumping breaks, and to be granted a private location to express breastmilk and refrigerated area to store breastmilk. Reviewed importance of draining breast regularly to support lactation. -Social determinants of health (SDOH) reviewed in EPIC. No concerns***The following needs were identified***  3. Sexuality, contraception and birth spacing - Patient {DOES_DOES FTD:32202} want a pregnancy in the next year.  Desired family size is {NUMBER 1-10:22536} children.  - Reviewed forms of contraception in tiered fashion. Patient desired {PLAN CONTRACEPTION:313102} today.   - Discussed birth spacing of 18 months  4. Sleep and fatigue -Encouraged family/partner/community support of 4 hrs of uninterrupted sleep to help with mood and fatigue  5. Physical Recovery  - Discussed patients delivery*** and complications - Patient had a *** degree laceration, perineal healing reviewed. Patient expressed understanding - Patient has urinary incontinence? {yes/no:20286}*** Patient was referred to pelvic floor PT  - Patient {ACTION; IS/IS RKY:70623762} safe to resume physical and sexual activity  6.  Health Maintenance - Last pap smear done *** and was {Normal/abnormal wildcard:19619} with negative HPV. ***Mammogram  7. ***Chronic  Disease - PCP follow up  I provided *** minutes of face-to-face time during this encounter.    No follow-ups on file.  No future appointments.  Clovis Pu, RN [facility] for [employer], [facility]

## 2021-05-08 NOTE — Progress Notes (Addendum)
MY CHART VIDEO POSTPARTUM VISIT ENCOUNTER NOTE  I connected with Mackenzie Mcknight on 05/08/21 at  2:10 PM EDT by My Chart video in her parked car and verified that I am speaking with the correct person using two identifiers. Provider location in [facility] office.   I discussed the limitations, risks, security and privacy concerns of performing an evaluation and management service by My Chart video and the availability of in person appointments. I also discussed with the patient that there may be a patient responsible charge related to this service. The patient expressed understanding and agreed to proceed.  Appointment Date: 05/08/2021  OBGYN Clinic: [facility]  Chief Complaint:  Postpartum Visit  History of Present Illness: Mackenzie Mcknight is a 30 y.o. African-American G1P1001 (Patient's last menstrual period was 07/07/2020 (exact date).), seen for the above chief complaint. Her past medical history is significant for none.  She is s/p normal spontaneous vaginal delivery with a 2 minute SD on 04/05/2021 at 38.5 weeks; she was discharged to home on PPD#2. 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 Baby is doing well, now weighs 8 lbs 9.5 oz (was 6 lbs 8 oz at birth).  Complains of low breast milk supply even after trying lactation cookies, lactation smoothie. She reports she is "only able to pump at most 2 oz per breast daily.   Vaginal bleeding or discharge: No  Mode of feeding infant: Bottle and Breast Intercourse: No  Contraception: abstinence, oral progesterone-only contraceptive PP depression s/s: No .  Any bowel or bladder issues: No  Pap smear: no abnormalities (date: 10/30/2020)  Review of Systems: Positive for breastfeeding difficulties. Her 12 point review of systems is negative or as noted in the History of Present Illness.  Patient Active Problem List   Diagnosis Date Noted   Vaginal  delivery 04/05/2021   Shoulder dystocia during labor and delivery 04/05/2021   PROM (premature rupture of membranes) 04/04/2021   Supervision of normal first pregnancy, antepartum 09/09/2020   Urinary tract infection affecting care of mother in first trimester, antepartum 08/27/2020    Medications Mackenzie Mcknight had no medications administered during this visit. Current Outpatient Medications  Medication Sig Dispense Refill   acetaminophen (TYLENOL) 500 MG tablet Take 2 tablets (1,000 mg total) by mouth every 6 (six) hours as needed for headache. 30 tablet 0   albuterol (VENTOLIN HFA) 108 (90 Base) MCG/ACT inhaler Inhale 1-2 puffs into the lungs every 6 (six) hours as needed for wheezing or shortness of breath. 18 g 0   ascorbic acid (VITAMIN C) 500 MG tablet Take one tablet by mouth twice a day every other day with Iron tablet. 30 tablet 3   coconut oil OIL Apply 1 application topically as needed.  0   doxylamine, Sleep, (UNISOM) 25 MG tablet Take 25 mg by mouth at bedtime as needed.     famotidine (PEPCID) 20 MG tablet Take 1 tablet (20 mg total) by mouth 2 (two) times daily. (Patient not taking: No sig reported) 60 tablet 2   ferrous sulfate (FERROUSUL) 325 (65 FE) MG tablet Take one tablet by mouth twice a day every other day with Vitamin C tablet. 30 tablet 3   Ferrous Sulfate (IRON) 325 (65 Fe) MG TABS Take 1 tablet (325 mg total) by mouth every other day. 30 tablet 0   ibuprofen (ADVIL) 600 MG tablet Take 1 tablet (600 mg total) by mouth every 6 (six) hours  as needed. 30 tablet 0   norethindrone (ORTHO MICRONOR) 0.35 MG tablet Take 1 tablet (0.35 mg total) by mouth daily. (Patient not taking: Reported on 05/08/2021) 28 tablet 11   Prenat w/o A-FeCbGl-DSS-FA-DHA (CITRANATAL ASSURE) 35-1 & 300 MG tablet Take 1 tablet by mouth daily. 6 each 0   No current facility-administered medications for this visit.    Allergies Peanut-containing drug products  Physical Exam:  General:  Alert,  oriented and cooperative.   Mental Status: Normal mood and affect perceived. Normal judgment and thought content.  Rest of physical exam deferred due to type of encounter  PP Depression Screening:    Assessment:Patient is a 30 y.o. G1P1001 who is 4 weeks postpartum from a normal spontaneous vaginal delivery.  She is doing well.   Plan: Encounter for postpartum care of lactating mother  Oral contraceptive pill surveillance   RTC 1 year or prn  I discussed the assessment and treatment plan with the patient. The patient was provided an opportunity to ask questions and all were answered. The patient agreed with the plan and demonstrated an understanding of the instructions.   The patient was advised to call back or seek an in-person evaluation/go to the ED for any concerning postpartum symptoms.  I provided 10 minutes of non-face-to-face time during this encounter. There was 5 minutes of chart review time spent prior to this encounter. Total time spent = 15 minutes.    Raelyn Mora, CNM [facility] [employer], [facility]

## 2021-05-12 ENCOUNTER — Encounter: Payer: Self-pay | Admitting: Obstetrics and Gynecology

## 2021-06-02 ENCOUNTER — Encounter: Payer: Self-pay | Admitting: *Deleted

## 2022-01-14 ENCOUNTER — Ambulatory Visit: Payer: Medicaid Other | Admitting: Obstetrics

## 2022-03-23 IMAGING — CR DG CHEST 2V
2 series · 2 of 2 positions shown · non-contrast
Comparison: Prior chest radiographs 09/28/2011 and earlier

CLINICAL DATA: Shortness of breath. Additional provided: Patient
tested positive for COVID [REDACTED], still experiencing shortness
of breath, cough, congestion.

EXAM:
CHEST - 2 VIEW

[w chest pa]
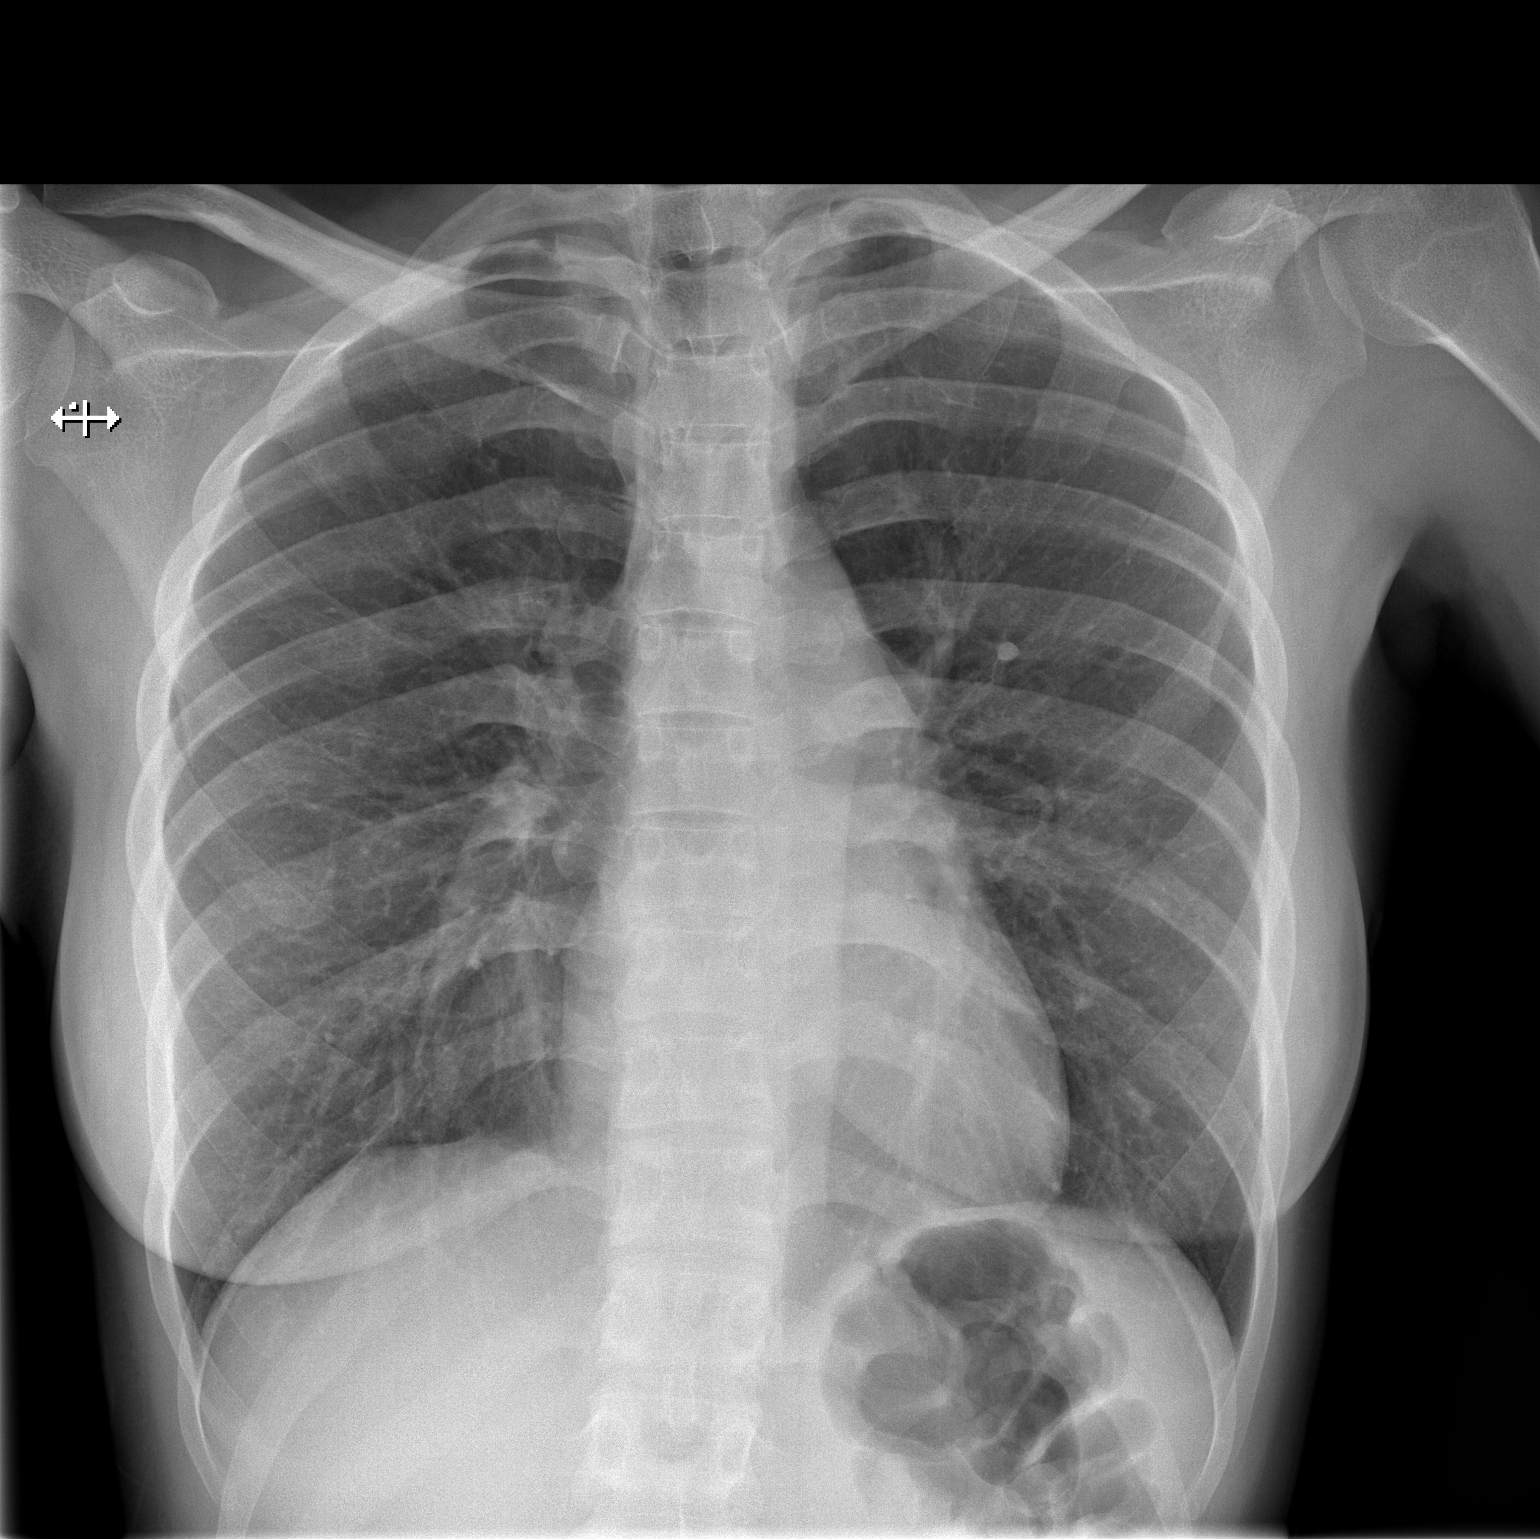

[w chest lat]
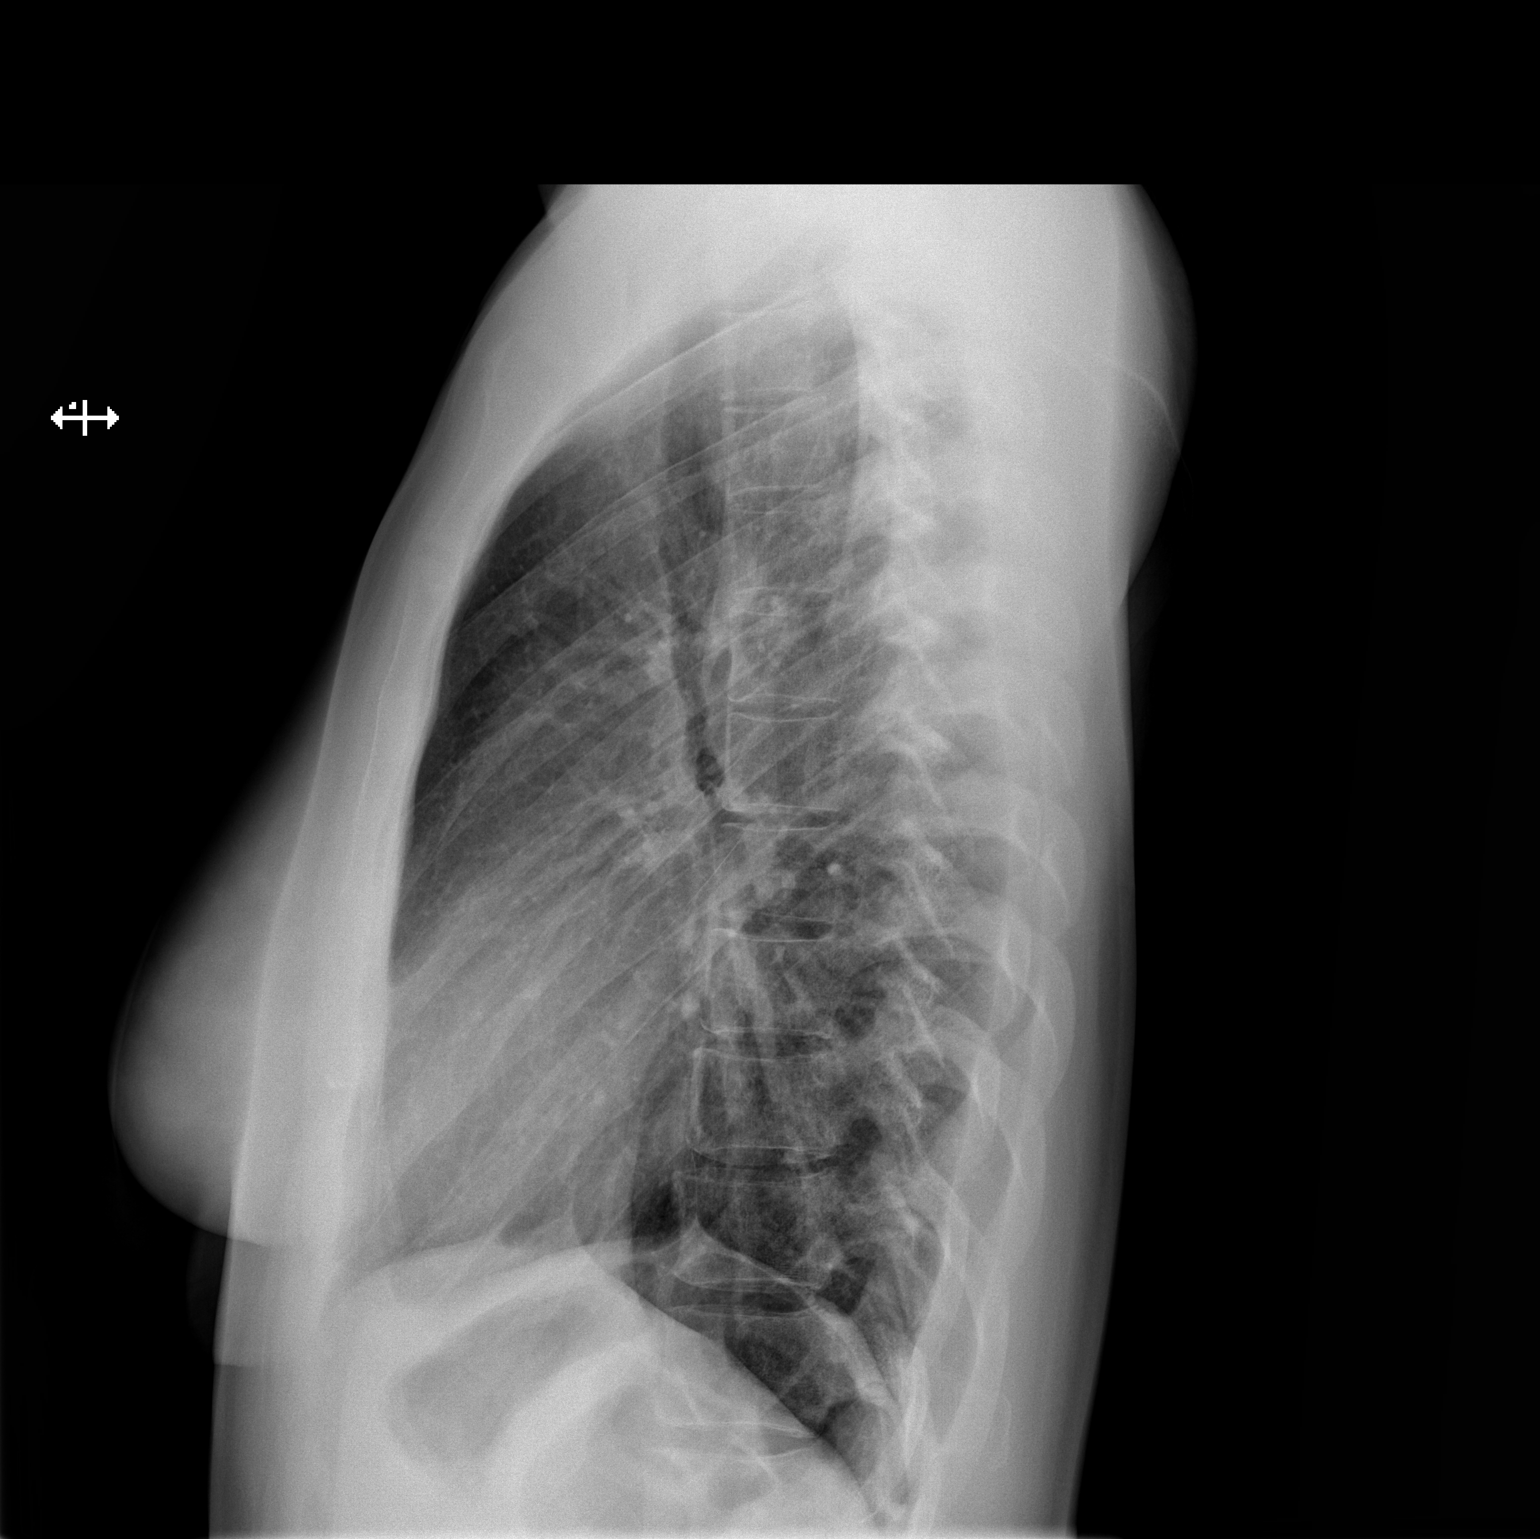

[2 of 2 positions shown; findings below may reference images not displayed]

FINDINGS: Heart size within normal limits.

There is no appreciable airspace consolidation.

No evidence of pleural effusion or pneumothorax.

No acute bony abnormality identified.
IMPRESSION: No evidence of acute cardiopulmonary abnormality.

## 2023-02-19 ENCOUNTER — Other Ambulatory Visit (HOSPITAL_COMMUNITY)
Admission: RE | Admit: 2023-02-19 | Discharge: 2023-02-19 | Disposition: A | Discharge status: Home / Self Care | Payer: Medicaid Other | Source: Ambulatory Visit | Attending: Radiology | Admitting: Radiology

## 2023-02-19 ENCOUNTER — Encounter: Payer: Self-pay | Admitting: Radiology

## 2023-02-19 ENCOUNTER — Ambulatory Visit: Payer: Medicaid Other | Admitting: Radiology

## 2023-02-19 VITALS — BP 104/68 | Ht 75.0 in | Wt 201.0 lb

## 2023-02-19 DIAGNOSIS — N841 Polyp of cervix uteri: Secondary | ICD-10-CM | POA: Insufficient documentation

## 2023-02-19 DIAGNOSIS — Z01419 Encounter for gynecological examination (general) (routine) without abnormal findings: Secondary | ICD-10-CM | POA: Diagnosis present

## 2023-02-19 DIAGNOSIS — Z113 Encounter for screening for infections with a predominantly sexual mode of transmission: Secondary | ICD-10-CM | POA: Insufficient documentation

## 2023-02-19 NOTE — Progress Notes (Signed)
Mackenzie Mcknight [birth year] [id number]   History:  32 y.o. G1P1 presents for annual exam. Reports increase in d/c recently. Has not been sexually active x 5 months, elects for STI screen. No other gyn concerns. Declines the need for [facility] right now.  Gynecologic History Patient's last menstrual period was 01/30/2023 (approximate). Period Cycle (Days): 28 Period Duration (Days): 7 Period Pattern: Regular Menstrual Flow: Heavy, Moderate (heavy first 3 days) Menstrual Control: Tampon, Maxi pad Dysmenorrhea: (!) Moderate Dysmenorrhea Symptoms: Cramping Contraception/Family planning: abstinence Sexually active: not x 5 months Last Pap: 2021. Results were: normal   Obstetric History OB History  Gravida Para Term Preterm AB Living  1 1 1     1   SAB IAB Ectopic Multiple Live Births          1    # Outcome Date GA Lbr Len/2nd Weight Sex Delivery Anes PTL Lv  1 Term 04/05/21 [redacted]w[redacted]d  6 lb 8.2 oz (2.955 kg) F Vag-Spont EPI  LIV     The following portions of the patient's history were reviewed and updated as appropriate: allergies, current medications, past family history, past medical history, past social history, past surgical history, and problem list.  Review of Systems Pertinent items noted in HPI and remainder of comprehensive ROS otherwise negative.   Past medical history, past surgical history, family history and social history were all reviewed and documented in the EPIC chart.   Exam:  Vitals:   02/19/23 1423  BP: 104/68  Weight: 201 lb (91.2 kg)  Height: 6\' 3"  (1.905 m)   Body mass index is 25.12 kg/m.  General appearance:  Normal Thyroid:  Symmetrical, normal in size, without palpable masses or nodularity. Respiratory  Auscultation:  Clear without wheezing or rhonchi Cardiovascular  Auscultation:  Regular rate, without rubs, murmurs or gallops  Edema/varicosities:  Not grossly evident Abdominal  Soft,nontender, without masses, guarding or  rebound.  Liver/spleen:  No organomegaly noted  Hernia:  None appreciated  Skin  Inspection:  Grossly normal Breasts: Examined lying and sitting.   Right: Without masses, retractions, nipple discharge or axillary adenopathy.   Left: Without masses, retractions, nipple discharge or axillary adenopathy. Genitourinary   Inguinal/mons:  Normal without inguinal adenopathy  External genitalia:  Normal appearing vulva with no masses, tenderness, or lesions  BUS/Urethra/Skene's glands:  Normal without masses or exudate  Vagina:  Normal appearing with normal color and discharge, no lesions  Cervix:  Normal appearing with a 21mm polyp extending from os. Removed with ring forceps without difficulty. Hemostasis maintained with pressure.  Uterus:  Normal in size, shape and contour.  Mobile, nontender  Adnexa/parametria:     Rt: Normal in size, without masses or tenderness.   Lt: Normal in size, without masses or tenderness.  Anus and perineum: Normal   Patient informed chaperone available to be present for breast and pelvic exam. Patient has requested no chaperone to be present. Patient has been advised what will be completed during breast and pelvic exam.   Assessment/Plan:   1. Well woman exam with routine gynecological exam - Cytology - PAP( [place])  2. Screening for STDs (sexually transmitted diseases) - Cytology - PAP( [place])  3. Cervical polyp - Surgical pathology( [place]/ [facility])     Discussed SBE,pap and STI  screening as directed/appropriate. Recommend 170mins of exercise weekly, including weight bearing exercise. Encouraged the use of seatbelts and sunscreen. Return in 1 year for annual or as needed.   Kamirah Shugrue B WHNP-BC 2:52 PM  02/19/2023  

## 2023-02-23 LAB — CYTOLOGY - PAP
Chlamydia: NEGATIVE
Comment: NEGATIVE
Comment: NEGATIVE
Comment: NORMAL
Diagnosis: NEGATIVE
Neisseria Gonorrhea: NEGATIVE
Trichomonas: NEGATIVE

## 2023-02-23 LAB — SURGICAL PATHOLOGY

## 2023-09-01 ENCOUNTER — Telehealth: Payer: Medicaid Other | Admitting: Physician Assistant

## 2023-09-01 DIAGNOSIS — R3989 Other symptoms and signs involving the genitourinary system: Secondary | ICD-10-CM | POA: Diagnosis not present

## 2023-09-01 MED ORDER — CEPHALEXIN 500 MG PO CAPS: 500.0000 mg | ORAL_CAPSULE | Freq: Two times a day (BID) | ORAL | 0 refills | Status: AC

## 2023-09-01 NOTE — Progress Notes (Signed)
Virtual Visit Consent   Mackenzie Mcknight, you are scheduled for a virtual visit with a [facility] provider today. Just as with appointments in the office, your consent must be obtained to participate. Your consent will be active for this visit and any virtual visit you may have with one of our providers in the next 365 days. If you have a MyChart account, a copy of this consent can be sent to you electronically.  As this is a virtual visit, video technology does not allow for your provider to perform a traditional examination. This may limit your provider's ability to fully assess your condition. If your provider identifies any concerns that need to be evaluated in person or the need to arrange testing (such as labs, EKG, etc.), we will make arrangements to do so. Although advances in technology are sophisticated, we cannot ensure that it will always work on either your end or our end. If the connection with a video visit is poor, the visit may have to be switched to a telephone visit. With either a video or telephone visit, we are not always able to ensure that we have a secure connection.  By engaging in this virtual visit, you consent to the provision of healthcare and authorize for your insurance to be billed (if applicable) for the services provided during this visit. Depending on your insurance coverage, you may receive a charge related to this service.  I need to obtain your verbal consent now. Are you willing to proceed with your visit today? [place] Borgen has provided verbal consent on 09/01/2023 for a virtual visit (video or telephone). Piedad Climes, [place]  Date: 09/01/2023 8:18 AM  Virtual Visit via Video Note   I, Piedad Climes, connected with  Mackenzie Mcknight  ([id number], 27-Feb-1991) on 09/01/23 at  8:15 AM EDT by a video-enabled telemedicine application and verified that I am speaking with the correct person using two identifiers.  Location: Patient: Virtual Visit  Location Patient: Home Provider: Virtual Visit Location Provider: Home Office   I discussed the limitations of evaluation and management by telemedicine and the availability of in person appointments. The patient expressed understanding and agreed to proceed.    History of Present Illness: Mackenzie Mcknight is a 32 y.o. who identifies as a female who was assigned female at birth, and is being seen today for possible UTI. Endorses a couple of days or urinary urgency and frequency with incomplete bladder emptying. Also noted cloudy urine. Denies abdominal pain, fever, chills, nausea or vomiting. Denies vaginal symptoms. LMP was 1.5 weeks ago.  Denies concern for pregnancy or STI. Is not breastfeeding.  HPI: HPI  Problems:  Patient Active Problem List   Diagnosis Date Noted   Vaginal delivery 04/05/2021   Shoulder dystocia during labor and delivery 04/05/2021   PROM (premature rupture of membranes) 04/04/2021   Urinary tract infection affecting care of mother in first trimester, antepartum 08/27/2020    Allergies:  Allergies  Allergen Reactions   Peanut-Containing Drug Products Anaphylaxis    Wheezing, Throat swelling   Other Itching    Honeydew   Medications:  Current Outpatient Medications:    cephALEXin (KEFLEX) 500 MG capsule, Take 1 capsule (500 mg total) by mouth 2 (two) times daily for 7 days., Disp: 14 capsule, Rfl: 0   doxycycline (ADOXA) 100 MG tablet, Take 100 mg by mouth daily., Disp: , Rfl:    acetaminophen (TYLENOL) 500 MG tablet, Take 2 tablets (1,000 mg total) by mouth every 6 (six)  hours as needed for headache., Disp: 30 tablet, Rfl: 0   coconut oil OIL, Apply 1 application topically as needed., Disp: , Rfl: 0  Observations/Objective: Patient is well-developed, well-nourished in no acute distress.  Resting comfortably at home.  Head is normocephalic, atraumatic.  No labored breathing. Speech is clear and coherent with logical content.  Patient is alert and oriented  at baseline.   Assessment and Plan: 1. Suspected UTI - cephALEXin (KEFLEX) 500 MG capsule; Take 1 capsule (500 mg total) by mouth 2 (two) times daily for 7 days.  Dispense: 14 capsule; Refill: 0  Classic UTI symptoms with absence of alarm signs or symptoms. Prior history of UTI. Will treat empirically with Keflex for suspected uncomplicated cystitis. Supportive measures and OTC medications reviewed. Strict in-person evaluation precautions discussed.    Follow Up Instructions: I discussed the assessment and treatment plan with the patient. The patient was provided an opportunity to ask questions and all were answered. The patient agreed with the plan and demonstrated an understanding of the instructions.  A copy of instructions were sent to the patient via MyChart unless otherwise noted below.   The patient was advised to call back or seek an in-person evaluation if the symptoms worsen or if the condition fails to improve as anticipated.  Time:  I spent 10 minutes with the patient via telehealth technology discussing the above problems/concerns.    Piedad Climes, PA-C

## 2023-09-01 NOTE — Patient Instructions (Signed)
Mackenzie Mcknight, thank you for joining Piedad Climes, PA-C for today's virtual visit.  While this provider is not your primary care provider (PCP), if your PCP is located in our provider database this encounter information will be shared with them immediately following your visit.   A [facility] MyChart account gives you access to today's visit and all your visits, tests, and labs performed at [facility] " click here if you don't have a [facility] MyChart account or go to [email or web address]  Consent: (Patient) Mackenzie Mcknight provided verbal consent for this virtual visit at the beginning of the encounter.  Current Medications:  Current Outpatient Medications:    acetaminophen (TYLENOL) 500 MG tablet, Take 2 tablets (1,000 mg total) by mouth every 6 (six) hours as needed for headache., Disp: 30 tablet, Rfl: 0   clindamycin (CLEOCIN T) 1 % external solution, Apply topically 2 (two) times daily as needed., Disp: , Rfl:    coconut oil OIL, Apply 1 application topically as needed., Disp: , Rfl: 0   doxycycline (VIBRAMYCIN) 100 MG capsule, Take 100 mg by mouth 2 (two) times daily., Disp: , Rfl:    Medications ordered in this encounter:  No orders of the defined types were placed in this encounter.    *If you need refills on other medications prior to your next appointment, please contact your pharmacy*  Follow-Up: Call back or seek an in-person evaluation if the symptoms worsen or if the condition fails to improve as anticipated.  [facility] [phone]  Other Instructions Your symptoms are consistent with a bladder infection, also called acute cystitis. Please take your antibiotic (Keflex) as directed until all pills are gone.  Stay very well hydrated.  Consider a daily probiotic (Align, Culturelle, or Activia) to help prevent stomach upset caused by the antibiotic.  Taking a probiotic daily may also help prevent recurrent UTIs.  Also consider  taking AZO (Phenazopyridine) tablets to help decrease pain with urination.  I will call you with your urine testing results.  We will change antibiotics if indicated.  Call or return to clinic if symptoms are not resolved by completion of antibiotic.   Urinary Tract Infection A urinary tract infection (UTI) can occur any place along the urinary tract. The tract includes the kidneys, ureters, bladder, and urethra. A type of germ called bacteria often causes a UTI. UTIs are often helped with antibiotic medicine.  HOME CARE  If given, take antibiotics as told by your doctor. Finish them even if you start to feel better. Drink enough fluids to keep your pee (urine) clear or pale yellow. Avoid tea, drinks with caffeine, and bubbly (carbonated) drinks. Pee often. Avoid holding your pee in for a long time. Pee before and after having sex (intercourse). Wipe from front to back after you poop (bowel movement) if you are a woman. Use each tissue only once. GET HELP RIGHT AWAY IF:  You have back pain. You have lower belly (abdominal) pain. You have chills. You feel sick to your stomach (nauseous). You throw up (vomit). Your burning or discomfort with peeing does not go away. You have a fever. Your symptoms are not better in 3 days. MAKE SURE YOU:  Understand these instructions. Will watch your condition. Will get help right away if you are not doing well or get worse. Document Released: 05/18/2008 Document Revised: 08/24/2012 Document Reviewed: 06/30/2012 [facility] Patient Information 2015 [place], [place]. This information is not intended to replace advice given to you by your  health care provider. Make sure you discuss any questions you have with your health care provider.    If you have been instructed to have an in-person evaluation today at a local Urgent Care facility, please use the link below. It will take you to a list of all of our available [facility], including address,  phone number and hours of operation. Please do not delay care.  [facility]  If you or a family member do not have a primary care provider, use the link below to schedule a visit and establish care. When you choose a [facility] primary care physician or advanced practice provider, you gain a long-term partner in health. Find a Primary Care Provider  Learn more about [facility] in-office and virtual care options: [facility] - Get Care Now

## 2023-12-01 ENCOUNTER — Telehealth: Payer: Medicaid Other | Admitting: Family Medicine

## 2023-12-01 DIAGNOSIS — B3731 Acute candidiasis of vulva and vagina: Secondary | ICD-10-CM

## 2023-12-01 MED ORDER — FLUCONAZOLE 150 MG PO TABS: 150.0000 mg | ORAL_TABLET | Freq: Once | ORAL | 0 refills | Status: AC

## 2023-12-01 NOTE — Patient Instructions (Addendum)
Mackenzie Mcknight, thank you for joining Freddy Finner, NP for today's virtual visit.  While this provider is not your primary care provider (PCP), if your PCP is located in our provider database this encounter information will be shared with them immediately following your visit.   A [facility] MyChart account gives you access to today's visit and all your visits, tests, and labs performed at [facility] " click here if you don't have a [facility] MyChart account or go to [email or web address]  Consent: (Patient) Mackenzie Mcknight provided verbal consent for this virtual visit at the beginning of the encounter.  Current Medications:  Current Outpatient Medications:    fluconazole (DIFLUCAN) 150 MG tablet, Take 1 tablet (150 mg total) by mouth once for 1 dose., Disp: 1 tablet, Rfl: 0   acetaminophen (TYLENOL) 500 MG tablet, Take 2 tablets (1,000 mg total) by mouth every 6 (six) hours as needed for headache., Disp: 30 tablet, Rfl: 0   coconut oil OIL, Apply 1 application topically as needed., Disp: , Rfl: 0   doxycycline (ADOXA) 100 MG tablet, Take 100 mg by mouth daily., Disp: , Rfl:    Medications ordered in this encounter:  Meds ordered this encounter  Medications   fluconazole (DIFLUCAN) 150 MG tablet    Sig: Take 1 tablet (150 mg total) by mouth once for 1 dose.    Dispense:  1 tablet    Refill:  0    Supervising Provider:   Merrilee Jansky [[id number]]     *If you need refills on other medications prior to your next appointment, please contact your pharmacy*  Follow-Up: Call back or seek an in-person evaluation if the symptoms worsen or if the condition fails to improve as anticipated.  [facility] [phone]  Other Instructions  Healthy vaginal hygiene practices   -  Avoid sleeper pajamas. Nightgowns allow air to circulate.  Sleep without underpants whenever possible.  -  Wear cotton underpants during the day. Double-rinse underwear after  washing to avoid residual irritants. Do not use fabric softeners for underwear and swimsuits.  - Avoid tights, leotards, leggings, "skinny" jeans, and other tight-fitting clothing. Skirts and loose-fitting pants allow air to circulate.  - Avoid pantyliners.  Instead use tampons or cotton pads.  - Use the restroom after intercourse to help prevent UTI's  - Daily warm bathing is helpful:     - Soak in clean water (no soap) for 10 to 15 minutes. Adding vinegar or baking soda to the water has not been specifically studied and may not be better than clean water alone.      - Use soap to wash regions other than the genital area just before getting out of the tub. Limit use of any soap on genital areas. Use fragance-free soaps.     - Rinse the genital area well and gently pat dry.  Don't rub.  Hair dryer to assist with drying can be used only if on cool setting.     - Do not use bubble baths or perfumed soaps.  - Do not use any feminine sprays, douches or powders.  These contain chemicals that will irritate the skin.  - If the genital area is tender or swollen, cool compresses may relieve the discomfort. Unscented wet wipes can be used instead of toilet paper for wiping.   - Emollients, such as Vaseline, may help protect skin and can be applied to the irritated area.  - Always remember to wipe front-to-back after  bowel movements. Pat dry after urination.  - Do not sit in wet swimsuits for long periods of time after swimming    If you have been instructed to have an in-person evaluation today at a local Urgent Care facility, please use the link below. It will take you to a list of all of our available [facility], including address, phone number and hours of operation. Please do not delay care.  [facility]  If you or a family member do not have a primary care provider, use the link below to schedule a visit and establish care. When you choose a [facility] primary care  physician or advanced practice provider, you gain a long-term partner in health. Find a Primary Care Provider  Learn more about [facility] in-office and virtual care options: [facility] - Get Care Now

## 2023-12-01 NOTE — Progress Notes (Unsigned)
Virtual Visit Consent   Mackenzie Mcknight, you are scheduled for a virtual visit with a  provider today. Just as with appointments in the office, your consent must be obtained to participate. Your consent will be active for this visit and any virtual visit you may have with one of our providers in the next 365 days. If you have a MyChart account, a copy of this consent can be sent to you electronically.  As this is a virtual visit, video technology does not allow for your provider to perform a traditional examination. This may limit your provider's ability to fully assess your condition. If your provider identifies any concerns that need to be evaluated in person or the need to arrange testing (such as labs, EKG, etc.), we will make arrangements to do so. Although advances in technology are sophisticated, we cannot ensure that it will always work on either your end or our end. If the connection with a video visit is poor, the visit may have to be switched to a telephone visit. With either a video or telephone visit, we are not always able to ensure that we have a secure connection.  By engaging in this virtual visit, you consent to the provision of healthcare and authorize for your insurance to be billed (if applicable) for the services provided during this visit. Depending on your insurance coverage, you may receive a charge related to this service.  I need to obtain your verbal consent now. Are you willing to proceed with your visit today? [place] Vince has provided verbal consent on 12/01/2023 for a virtual visit (video or telephone). Freddy Finner, NP  Date: 12/01/2023 3:40 PM  Virtual Visit via Video Note   I, Freddy Finner, connected with  Mackenzie Mcknight  ([id number], November 09, 1991) on 12/01/23 at  3:45 PM EST by a video-enabled telemedicine application and verified that I am speaking with the correct person using two identifiers.  Location: Patient: Virtual Visit Location Patient:  Home Provider: Virtual Visit Location Provider: Home Office   I discussed the limitations of evaluation and management by telemedicine and the availability of in person appointments. The patient expressed understanding and agreed to proceed.    History of Present Illness: Mackenzie Mcknight is a 32 y.o. who identifies as a female who was assigned female at birth, and is being seen today for yeast infection   Started yesterday after a shower that she used a body wash with, and then she was in pain after so had a warm bath. That helped while in it. But did not improve.  Stinging of the labia areas and mild swelling. Denies discharge No new clothing, no sexual intercourse.  Due for menstrual cycle this week. Reports it was like a past yeast infection that caused this sensation prior as well. Using Tylenol for comfort.  Has had BV she thinks.     Problems:  Patient Active Problem List   Diagnosis Date Noted   Vaginal delivery 04/05/2021   Shoulder dystocia during labor and delivery 04/05/2021   PROM (premature rupture of membranes) 04/04/2021   Urinary tract infection affecting care of mother in first trimester, antepartum 08/27/2020    Allergies:  Allergies  Allergen Reactions   Peanut-Containing Drug Products Anaphylaxis    Wheezing, Throat swelling   Other Itching    Honeydew   Medications:  Current Outpatient Medications:    acetaminophen (TYLENOL) 500 MG tablet, Take 2 tablets (1,000 mg total) by mouth every 6 (six) hours as needed for  headache., Disp: 30 tablet, Rfl: 0   coconut oil OIL, Apply 1 application topically as needed., Disp: , Rfl: 0   doxycycline (ADOXA) 100 MG tablet, Take 100 mg by mouth daily., Disp: , Rfl:   Observations/Objective: Patient is well-developed, well-nourished in no acute distress.  Resting comfortably  at home.  Head is normocephalic, atraumatic.  No labored breathing.  Speech is clear and coherent with logical content.  Patient is alert and  oriented at baseline.    Assessment and Plan:  1. Yeast vaginitis (Primary)  - fluconazole (DIFLUCAN) 150 MG tablet; Take 1 tablet (150 mg total) by mouth once for 1 dose.  Dispense: 1 tablet; Refill: 0   -avoid fragrance in soaps -plain unscented is best -follow up in person if not improving  -health practices on AVS   Reviewed side effects, risks and benefits of medication.    Patient acknowledged agreement and understanding of the plan.   Past Medical, Surgical, Social History, Allergies, and Medications have been Reviewed.     Follow Up Instructions: I discussed the assessment and treatment plan with the patient. The patient was provided an opportunity to ask questions and all were answered. The patient agreed with the plan and demonstrated an understanding of the instructions.  A copy of instructions were sent to the patient via MyChart unless otherwise noted below.   The patient was advised to call back or seek an in-person evaluation if the symptoms worsen or if the condition fails to improve as anticipated.    Freddy Finner, NP

## 2025-01-10 ENCOUNTER — Telehealth: Admitting: Physician Assistant

## 2025-01-10 DIAGNOSIS — R112 Nausea with vomiting, unspecified: Secondary | ICD-10-CM

## 2025-01-10 DIAGNOSIS — R6889 Other general symptoms and signs: Secondary | ICD-10-CM

## 2025-01-10 DIAGNOSIS — J029 Acute pharyngitis, unspecified: Secondary | ICD-10-CM

## 2025-01-10 DIAGNOSIS — R509 Fever, unspecified: Secondary | ICD-10-CM

## 2025-01-10 DIAGNOSIS — R52 Pain, unspecified: Secondary | ICD-10-CM | POA: Diagnosis not present

## 2025-01-10 MED ORDER — ONDANSETRON 4 MG PO TBDP: 4.0000 mg | ORAL_TABLET | Freq: Three times a day (TID) | ORAL | 0 refills | Status: AC | PRN

## 2025-01-10 MED ORDER — OSELTAMIVIR PHOSPHATE 75 MG PO CAPS: 75.0000 mg | ORAL_CAPSULE | Freq: Two times a day (BID) | ORAL | 0 refills | Status: AC

## 2025-01-10 MED ORDER — IBUPROFEN 600 MG PO TABS: 600.0000 mg | ORAL_TABLET | Freq: Three times a day (TID) | ORAL | 0 refills | Status: AC | PRN

## 2025-01-10 MED ORDER — LIDOCAINE VISCOUS HCL 2 % MT SOLN: 5.0000 mL | Freq: Four times a day (QID) | OROMUCOSAL | 0 refills | Status: AC | PRN

## 2025-01-10 NOTE — Progress Notes (Signed)
 " Virtual Visit Consent   Mackenzie Mcknight, you are scheduled for a virtual visit with a [facility] provider today. Just as with appointments in the office, your consent must be obtained to participate. Your consent will be active for this visit and any virtual visit you may have with one of our providers in the next 365 days. If you have a MyChart account, a copy of this consent can be sent to you electronically.  As this is a virtual visit, video technology does not allow for your provider to perform a traditional examination. This may limit your provider's ability to fully assess your condition. If your provider identifies any concerns that need to be evaluated in person or the need to arrange testing (such as labs, EKG, etc.), we will make arrangements to do so. Although advances in technology are sophisticated, we cannot ensure that it will always work on either your end or our end. If the connection with a video visit is poor, the visit may have to be switched to a telephone visit. With either a video or telephone visit, we are not always able to ensure that we have a secure connection.  By engaging in this virtual visit, you consent to the provision of healthcare and authorize for your insurance to be billed (if applicable) for the services provided during this visit. Depending on your insurance coverage, you may receive a charge related to this service.  I need to obtain your verbal consent now. Are you willing to proceed with your visit today? Mackenzie Mcknight has provided verbal consent on 01/10/2025 for a virtual visit (video or telephone). [name], PA-C  Date: 01/10/2025 11:02 AM   Virtual Visit via Video Note   I, [name], connected with  Mackenzie Mcknight  ([id number], 16-Feb-1991) on 01/10/25 at 10:45 AM EST by a video-enabled telemedicine application and verified that I am speaking with the correct person using two identifiers.  Location: Patient: Virtual Visit  Location Patient: Home Provider: Virtual Visit Location Provider: Home Office   I discussed the limitations of evaluation and management by telemedicine and the availability of in person appointments. The patient expressed understanding and agreed to proceed.    History of Present Illness: Mackenzie Mcknight is a 34 y.o. who identifies as a female who was assigned female at birth, and is being seen today for flu like symptoms.  HPI: Sore Throat  This is a new problem. The current episode started in the past 7 days (2 days ago). The problem has been gradually worsening. The maximum temperature recorded prior to her arrival was 102 - 102.9 F (102). The fever has been present for 1 to 2 days. The pain is moderate. Associated symptoms include congestion, ear pain, headaches, a hoarse voice, swollen glands, trouble swallowing and vomiting. Pertinent negatives include no coughing, diarrhea, drooling, ear discharge or plugged ear sensation. Associated symptoms comments: Chills, body aches, decreased appetite, nasal congestion, rhinorrhea. Exposure to: daughter has strep. She has tried acetaminophen  architectural technologist, theraflu) for the symptoms. The treatment provided no relief.     Problems:  Patient Active Problem List   Diagnosis Date Noted   Vaginal delivery 04/05/2021   Shoulder dystocia during labor and delivery 04/05/2021   PROM (premature rupture of membranes) 04/04/2021   Urinary tract infection affecting care of mother in first trimester, antepartum 08/27/2020    Allergies: Allergies[1] Medications: Current Medications[2]  Observations/Objective: Patient is well-developed, well-nourished in no acute distress.  Resting comfortably at home.  Head  is normocephalic, atraumatic.  No labored breathing.  Speech is clear and coherent with logical content.  Patient is alert and oriented at baseline.    Assessment and Plan: 1. Body aches (Primary) - oseltamivir  (TAMIFLU ) 75 MG capsule; Take 1  capsule (75 mg total) by mouth 2 (two) times daily.  Dispense: 10 capsule; Refill: 0 - ibuprofen  (ADVIL ) 600 MG tablet; Take 1 tablet (600 mg total) by mouth every 8 (eight) hours as needed.  Dispense: 30 tablet; Refill: 0  2. Fever, unspecified fever cause - oseltamivir  (TAMIFLU ) 75 MG capsule; Take 1 capsule (75 mg total) by mouth 2 (two) times daily.  Dispense: 10 capsule; Refill: 0 - ibuprofen  (ADVIL ) 600 MG tablet; Take 1 tablet (600 mg total) by mouth every 8 (eight) hours as needed.  Dispense: 30 tablet; Refill: 0  3. Sore throat - oseltamivir  (TAMIFLU ) 75 MG capsule; Take 1 capsule (75 mg total) by mouth 2 (two) times daily.  Dispense: 10 capsule; Refill: 0 - lidocaine  (XYLOCAINE ) 2 % solution; Use as directed 5-10 mLs in the mouth or throat every 6 (six) hours as needed.  Dispense: 100 mL; Refill: 0  4. Nausea and vomiting, unspecified vomiting type - oseltamivir  (TAMIFLU ) 75 MG capsule; Take 1 capsule (75 mg total) by mouth 2 (two) times daily.  Dispense: 10 capsule; Refill: 0 - ondansetron  (ZOFRAN -ODT) 4 MG disintegrating tablet; Take 1 tablet (4 mg total) by mouth every 8 (eight) hours as needed.  Dispense: 20 tablet; Refill: 0  5. Flu-like symptoms - oseltamivir  (TAMIFLU ) 75 MG capsule; Take 1 capsule (75 mg total) by mouth 2 (two) times daily.  Dispense: 10 capsule; Refill: 0  - Suspect influenza due to symptoms and high community prevalence  - Tamiflu  prescribed -  Ibuprofen  for pain and fevers - Viscous Lidocaine  for sore throat - Zofran  for nausea and vomiting - Continue OTC medication of choice for symptomatic management - Push fluids - Rest - Seek in person evaluation if symptoms worsen or fail to improve   Follow Up Instructions: I discussed the assessment and treatment plan with the patient. The patient was provided an opportunity to ask questions and all were answered. The patient agreed with the plan and demonstrated an understanding of the instructions.  A copy  of instructions were sent to the patient via MyChart unless otherwise noted below.    The patient was advised to call back or seek an in-person evaluation if the symptoms worsen or if the condition fails to improve as anticipated.    [name], PA-C     [1]  Allergies Allergen Reactions   Peanut-Containing Drug Products Anaphylaxis    Wheezing, Throat swelling   Other Itching    Honeydew  [2]  Current Outpatient Medications:    ibuprofen  (ADVIL ) 600 MG tablet, Take 1 tablet (600 mg total) by mouth every 8 (eight) hours as needed., Disp: 30 tablet, Rfl: 0   lidocaine  (XYLOCAINE ) 2 % solution, Use as directed 5-10 mLs in the mouth or throat every 6 (six) hours as needed., Disp: 100 mL, Rfl: 0   ondansetron  (ZOFRAN -ODT) 4 MG disintegrating tablet, Take 1 tablet (4 mg total) by mouth every 8 (eight) hours as needed., Disp: 20 tablet, Rfl: 0   oseltamivir  (TAMIFLU ) 75 MG capsule, Take 1 capsule (75 mg total) by mouth 2 (two) times daily., Disp: 10 capsule, Rfl: 0   acetaminophen  (TYLENOL ) 500 MG tablet, Take 2 tablets (1,000 mg total) by mouth every 6 (six) hours as needed for  headache., Disp: 30 tablet, Rfl: 0   coconut oil OIL, Apply 1 application topically as needed., Disp: , Rfl: 0   doxycycline (ADOXA) 100 MG tablet, Take 100 mg by mouth daily., Disp: , Rfl:   "

## 2025-01-10 NOTE — Patient Instructions (Signed)
 " Mackenzie Mcknight, thank you for joining Mackenzie CHRISTELLA Dickinson, PA-C for today's virtual visit.  While this provider is not your primary care provider (PCP), if your PCP is located in our provider database this encounter information will be shared with them immediately following your visit.   A [facility] MyChart account gives you access to today's visit and all your visits, tests, and labs performed at [facility]  click here if you don't have a [facility] MyChart account or go to [email or web address]  Consent: (Patient) Mackenzie Mcknight provided verbal consent for this virtual visit at the beginning of the encounter.  Current Medications:  Current Outpatient Medications:    ibuprofen  (ADVIL ) 600 MG tablet, Take 1 tablet (600 mg total) by mouth every 8 (eight) hours as needed., Disp: 30 tablet, Rfl: 0   lidocaine  (XYLOCAINE ) 2 % solution, Use as directed 5-10 mLs in the mouth or throat every 6 (six) hours as needed., Disp: 100 mL, Rfl: 0   ondansetron  (ZOFRAN -ODT) 4 MG disintegrating tablet, Take 1 tablet (4 mg total) by mouth every 8 (eight) hours as needed., Disp: 20 tablet, Rfl: 0   oseltamivir  (TAMIFLU ) 75 MG capsule, Take 1 capsule (75 mg total) by mouth 2 (two) times daily., Disp: 10 capsule, Rfl: 0   acetaminophen  (TYLENOL ) 500 MG tablet, Take 2 tablets (1,000 mg total) by mouth every 6 (six) hours as needed for headache., Disp: 30 tablet, Rfl: 0   coconut oil OIL, Apply 1 application topically as needed., Disp: , Rfl: 0   doxycycline (ADOXA) 100 MG tablet, Take 100 mg by mouth daily., Disp: , Rfl:    Medications ordered in this encounter:  Meds ordered this encounter  Medications   oseltamivir  (TAMIFLU ) 75 MG capsule    Sig: Take 1 capsule (75 mg total) by mouth 2 (two) times daily.    Dispense:  10 capsule    Refill:  0    Supervising Provider:   LAMPTEY, PHILIP O [[id number]]   ibuprofen  (ADVIL ) 600 MG tablet    Sig: Take 1 tablet (600 mg total) by mouth every 8  (eight) hours as needed.    Dispense:  30 tablet    Refill:  0    Supervising Provider:   LAMPTEY, PHILIP O [[id number]]   lidocaine  (XYLOCAINE ) 2 % solution    Sig: Use as directed 5-10 mLs in the mouth or throat every 6 (six) hours as needed.    Dispense:  100 mL    Refill:  0    Supervising Provider:   BLAISE ALEENE KIDD [id number]   ondansetron  (ZOFRAN -ODT) 4 MG disintegrating tablet    Sig: Take 1 tablet (4 mg total) by mouth every 8 (eight) hours as needed.    Dispense:  20 tablet    Refill:  0    Supervising Provider:   LAMPTEY, PHILIP O [[id number]]     *If you need refills on other medications prior to your next appointment, please contact your pharmacy*  Follow-Up: Call back or seek an in-person evaluation if the symptoms worsen or if the condition fails to improve as anticipated.  [facility] [phone]  Other Instructions Influenza, Adult Influenza is also called the flu. It's an infection that affects your respiratory tract. This includes your nose, throat, windpipe, and lungs. The flu is contagious. This means it spreads easily from person to person. It causes symptoms that are like a cold. It can also cause a high fever and body aches. What are the  causes? The flu is caused by the influenza virus. You can get it by: Breathing in droplets that are in the air after an infected person coughs or sneezes. Touching something that has the virus on it and then touching your mouth, nose, or eyes. What increases the risk? You may be more likely to get the flu if: You don't wash your hands often. You're near a lot of people during cold and flu season. You touch your mouth, eyes, or nose without washing your hands first. You don't get a flu shot each year. You may also be more at risk for the flu and serious problems, such as a lung infection called pneumonia, if: You're older than 65. You're pregnant. Your immune system is weak. Your immune system is your body's  defense system. You have a long-term, or chronic, condition, such as: Heart, kidney, or lung disease. Diabetes. A liver disorder. Asthma. You're very overweight. You have anemia. This is when you don't have enough red blood cells in your body. What are the signs or symptoms? Flu symptoms often start all of a sudden. They may last 4-14 days and include: Fever and chills. Headaches, body aches, or muscle aches. Sore throat. Cough. Runny or stuffy nose. Discomfort in your chest. Not wanting to eat as much as normal. Feeling weak or tired. Feeling dizzy. Nausea or vomiting. How is this diagnosed? The flu may be diagnosed based on your symptoms and medical history. You may also have a physical exam. A swab may be taken from your nose or throat and tested for the virus. How is this treated? If the flu is found early, you can be treated with antiviral medicine. This may be given to you by mouth or through an IV. It can help you feel less sick and get better faster. Taking care of yourself at home can also help your symptoms get better. Your health care provider may tell you to: Take over-the-counter medicines. Drink lots of fluids. The flu often goes away on its own. If you have very bad symptoms or problems caused by the flu, you may need to be treated in a hospital. Follow these instructions at home: Activity Rest as needed. Get lots of sleep. Stay home from work or school as told by your provider. Leave home only to go see your provider. Do not leave home for other reasons until you don't have a fever for 24 hours without taking medicine. Eating and drinking Take an oral rehydration solution (ORS). This is a drink that is sold at pharmacies and stores. Drink enough fluid to keep your pee pale yellow. Try to drink small amounts of clear fluids. These include water, ice chips, fruit juice mixed with water, and low-calorie sports drinks. Try to eat bland foods that are easy to digest.  These include bananas, applesauce, rice, lean meats, toast, and crackers. Avoid drinks that have a lot of sugar or caffeine in them. These include energy drinks, regular sports drinks, and soda. Do not drink alcohol. Do not eat spicy or fatty foods. General instructions     Take your medicines only as told by your provider. Use a cool mist humidifier to add moisture to the air in your home. This can make it easier for you to breathe. You should also clean the humidifier every day. To do so: Empty the water. Pour clean water in. Cover your mouth and nose when you cough or sneeze. Wash your hands with soap and water often and for at  least 20 seconds. It's extra important to do so after you cough or sneeze. If you can't use soap and water, use hand sanitizer. How is this prevented?  Get a flu shot every year. Ask your provider when you should get your flu shot. Stay away from people who are sick during fall and winter. Fall and winter are cold and flu season. Contact a health care provider if: You get new symptoms. You have chest pain. You have watery poop, also called diarrhea. You have a fever. Your cough gets worse. You start to have more mucus. You feel like you may vomit, or you vomit. Get help right away if: You become short of breath or have trouble breathing. Your skin or nails turn blue. You have very bad pain or stiffness in your neck. You get a sudden headache or pain in your face or ear. You vomit each time you eat or drink. These symptoms may be an emergency. Call 911 right away. Do not wait to see if the symptoms will go away. Do not drive yourself to the hospital. This information is not intended to replace advice given to you by your health care provider. Make sure you discuss any questions you have with your health care provider. Document Revised: 09/02/2023 Document Reviewed: 01/07/2023 Elsevier Patient Education  2024 Elsevier Inc.   If you have been  instructed to have an in-person evaluation today at a local Urgent Care facility, please use the link below. It will take you to a list of all of our available [facility], including address, phone number and hours of operation. Please do not delay care.  [facility]  If you or a family member do not have a primary care provider, use the link below to schedule a visit and establish care. When you choose a [facility] primary care physician or advanced practice provider, you gain a long-term partner in health. Find a Primary Care Provider  Learn more about [facility]'s in-office and virtual care options:  - Get Care Now "

## 2025-01-11 ENCOUNTER — Encounter: Payer: Self-pay | Admitting: Physician Assistant

## 2025-01-12 ENCOUNTER — Ambulatory Visit
Admission: EM | Admit: 2025-01-12 | Discharge: 2025-01-12 | Disposition: A | Discharge status: Home / Self Care | Attending: Family Medicine | Admitting: Family Medicine

## 2025-01-12 DIAGNOSIS — J029 Acute pharyngitis, unspecified: Secondary | ICD-10-CM | POA: Diagnosis present

## 2025-01-12 DIAGNOSIS — R070 Pain in throat: Secondary | ICD-10-CM | POA: Insufficient documentation

## 2025-01-12 LAB — POCT INFLUENZA A/B
Influenza A, POC: NEGATIVE
Influenza B, POC: NEGATIVE

## 2025-01-12 LAB — POCT MONO SCREEN (KUC): Mono, POC: NEGATIVE

## 2025-01-12 LAB — POCT RAPID STREP A (OFFICE): Rapid Strep A Screen: NEGATIVE

## 2025-01-12 MED ORDER — AMOXICILLIN 875 MG PO TABS: 875.0000 mg | ORAL_TABLET | Freq: Two times a day (BID) | ORAL | 0 refills | Status: AC

## 2025-01-12 NOTE — Discharge Instructions (Addendum)
 Start amoxicillin  to address bacterial pharyngitis. For sore throat or cough try using a honey-based tea. Use 3 teaspoons of honey with juice squeezed from half lemon. Place shaved pieces of ginger into 1/2-1 cup of water and warm over stove top. Then mix the ingredients and repeat every 4 hours as needed. Please take ibuprofen  600mg  every 6 hours with food alternating with OR taken together with Tylenol  500mg -650mg  every 6 hours for throat pain, fevers, aches and pains. Hydrate very well with at least 2 liters of water. Eat light meals such as soups (chicken and noodles, vegetable, chicken and wild rice).  Do not eat foods that you are allergic to.  Taking an antihistamine like Zyrtec (10mg  daily) can help against postnasal drainage, sinus congestion which can cause sinus pain, sinus headaches, throat pain, painful swallowing, coughing.

## 2025-01-14 ENCOUNTER — Ambulatory Visit (HOSPITAL_COMMUNITY): Payer: Self-pay | Admitting: Urgent Care

## 2025-01-14 LAB — CULTURE, GROUP A STREP (THRC)
